# Patient Record
Sex: Female | Born: 1980 | Hispanic: No | Marital: Married | Smoking: Never smoker
Health system: Southern US, Community
[De-identification: ages and names within clinical notes are randomized; demographics above are authoritative.]

## PROBLEM LIST (undated history)

## (undated) DIAGNOSIS — N809 Endometriosis, unspecified: Secondary | ICD-10-CM

## (undated) DIAGNOSIS — J45909 Unspecified asthma, uncomplicated: Secondary | ICD-10-CM

## (undated) HISTORY — PX: NO PAST SURGERIES: SHX2092

## (undated) HISTORY — PX: DIAGNOSTIC LAPAROSCOPY: SUR761

---

## 2008-08-27 ENCOUNTER — Other Ambulatory Visit: Admission: RE | Admit: 2008-08-27 | Discharge: 2008-08-27 | Payer: Self-pay | Admitting: Family Medicine

## 2011-04-23 ENCOUNTER — Other Ambulatory Visit (HOSPITAL_COMMUNITY)
Admission: RE | Admit: 2011-04-23 | Discharge: 2011-04-23 | Disposition: A | Discharge status: Home / Self Care | Payer: 59 | Source: Ambulatory Visit | Attending: Family Medicine | Admitting: Family Medicine

## 2011-04-23 DIAGNOSIS — Z1159 Encounter for screening for other viral diseases: Secondary | ICD-10-CM | POA: Insufficient documentation

## 2011-04-23 DIAGNOSIS — Z124 Encounter for screening for malignant neoplasm of cervix: Secondary | ICD-10-CM | POA: Insufficient documentation

## 2012-05-31 ENCOUNTER — Other Ambulatory Visit: Payer: Self-pay | Admitting: Obstetrics & Gynecology

## 2012-06-01 ENCOUNTER — Encounter (HOSPITAL_COMMUNITY): Payer: Self-pay | Admitting: Pharmacy Technician

## 2012-06-02 ENCOUNTER — Encounter (HOSPITAL_COMMUNITY): Payer: Self-pay

## 2012-06-02 ENCOUNTER — Encounter (HOSPITAL_COMMUNITY)
Admission: RE | Admit: 2012-06-02 | Discharge: 2012-06-02 | Disposition: A | Discharge status: Home / Self Care | Payer: Managed Care, Other (non HMO) | Source: Ambulatory Visit | Attending: Obstetrics & Gynecology | Admitting: Obstetrics & Gynecology

## 2012-06-02 HISTORY — DX: Unspecified asthma, uncomplicated: J45.909

## 2012-06-02 LAB — CBC
MCH: 30.1 pg (ref 26.0–34.0)
Platelets: 237 10*3/uL (ref 150–400)
RBC: 4.48 MIL/uL (ref 3.87–5.11)
RDW: 12.2 % (ref 11.5–15.5)

## 2012-06-02 LAB — SURGICAL PCR SCREEN: MRSA, PCR: NEGATIVE

## 2012-06-02 NOTE — Patient Instructions (Addendum)
Your procedure is scheduled on:06/03/12  Enter through the Main Entrance at :0830 am Pick up desk phone and dial [phone] and inform us of your arrival.  Please call [phone] if you have any problems the morning of surgery.  Remember: Do not eat or drink after midnight:tonight  Bring inhaler to hospital  DO NOT wear jewelry, eye make-up, lipstick,body lotion, or dark fingernail polish. Do not shave for 48 hours prior to surgery.  If you are to be admitted after surgery, leave suitcase in car until your room has been assigned. Patients discharged on the day of surgery will not be allowed to drive home.

## 2012-06-03 ENCOUNTER — Ambulatory Visit (HOSPITAL_COMMUNITY)
Admission: RE | Admit: 2012-06-03 | Discharge: 2012-06-03 | Disposition: A | Discharge status: Home / Self Care | Payer: Managed Care, Other (non HMO) | Source: Ambulatory Visit | Attending: Obstetrics & Gynecology | Admitting: Obstetrics & Gynecology

## 2012-06-03 ENCOUNTER — Encounter (HOSPITAL_COMMUNITY)
Admission: RE | Disposition: A | Discharge status: Home / Self Care | Payer: Self-pay | Source: Ambulatory Visit | Attending: Obstetrics & Gynecology

## 2012-06-03 ENCOUNTER — Ambulatory Visit (HOSPITAL_COMMUNITY): Payer: Managed Care, Other (non HMO)

## 2012-06-03 ENCOUNTER — Encounter (HOSPITAL_COMMUNITY): Payer: Self-pay | Admitting: *Deleted

## 2012-06-03 ENCOUNTER — Encounter (HOSPITAL_COMMUNITY): Payer: Self-pay

## 2012-06-03 DIAGNOSIS — N803 Endometriosis of pelvic peritoneum, unspecified: Secondary | ICD-10-CM | POA: Insufficient documentation

## 2012-06-03 DIAGNOSIS — N83209 Unspecified ovarian cyst, unspecified side: Secondary | ICD-10-CM | POA: Insufficient documentation

## 2012-06-03 DIAGNOSIS — D259 Leiomyoma of uterus, unspecified: Secondary | ICD-10-CM | POA: Insufficient documentation

## 2012-06-03 HISTORY — PX: LAPAROSCOPIC OVARIAN CYSTECTOMY: SHX6248

## 2012-06-03 LAB — HCG, SERUM, QUALITATIVE: Preg, Serum: NEGATIVE

## 2012-06-03 SURGERY — EXCISION, CYST, OVARY, LAPAROSCOPIC
Anesthesia: General | Site: Abdomen | Laterality: Left | Wound class: Clean Contaminated

## 2012-06-03 MED ORDER — ONDANSETRON HCL 4 MG/2ML IJ SOLN
INTRAMUSCULAR | Status: AC
  Filled 2012-06-03: qty 2

## 2012-06-03 MED ORDER — CEFAZOLIN SODIUM-DEXTROSE 2-3 GM-% IV SOLR
2.0000 g | INTRAVENOUS | Status: AC
  Administered 2012-06-03: 2 g via INTRAVENOUS

## 2012-06-03 MED ORDER — DEXAMETHASONE SODIUM PHOSPHATE 10 MG/ML IJ SOLN
INTRAMUSCULAR | Status: AC
  Filled 2012-06-03: qty 1

## 2012-06-03 MED ORDER — PROPOFOL 10 MG/ML IV EMUL
INTRAVENOUS | Status: AC
  Filled 2012-06-03: qty 20

## 2012-06-03 MED ORDER — FENTANYL CITRATE 0.05 MG/ML IJ SOLN
25.0000 ug | INTRAMUSCULAR | Status: DC | PRN
  Administered 2012-06-03: 25 ug via INTRAVENOUS

## 2012-06-03 MED ORDER — 0.9 % SODIUM CHLORIDE (POUR BTL) OPTIME
TOPICAL | Status: DC | PRN
  Administered 2012-06-03: 1000 mL

## 2012-06-03 MED ORDER — OXYCODONE-ACETAMINOPHEN 5-325 MG PO TABS: 1.0000 | ORAL_TABLET | ORAL | Status: DC | PRN

## 2012-06-03 MED ORDER — GLYCOPYRROLATE 0.2 MG/ML IJ SOLN
INTRAMUSCULAR | Status: AC
  Filled 2012-06-03: qty 1

## 2012-06-03 MED ORDER — LIDOCAINE HCL (CARDIAC) 20 MG/ML IV SOLN
INTRAVENOUS | Status: DC | PRN
  Administered 2012-06-03: 60 mg via INTRAVENOUS

## 2012-06-03 MED ORDER — BUPIVACAINE HCL (PF) 0.25 % IJ SOLN
INTRAMUSCULAR | Status: AC
  Filled 2012-06-03: qty 30

## 2012-06-03 MED ORDER — PROPOFOL 10 MG/ML IV EMUL
INTRAVENOUS | Status: DC | PRN
  Administered 2012-06-03: 150 mg via INTRAVENOUS

## 2012-06-03 MED ORDER — NEOSTIGMINE METHYLSULFATE 1 MG/ML IJ SOLN
INTRAMUSCULAR | Status: AC
  Filled 2012-06-03: qty 1

## 2012-06-03 MED ORDER — MIDAZOLAM HCL 5 MG/5ML IJ SOLN
INTRAMUSCULAR | Status: DC | PRN
  Administered 2012-06-03 (×2): 1 mg via INTRAVENOUS

## 2012-06-03 MED ORDER — LACTATED RINGERS IV SOLN
INTRAVENOUS | Status: DC
Start: 2012-06-03 — End: 2012-06-03
  Administered 2012-06-03 (×4): via INTRAVENOUS

## 2012-06-03 MED ORDER — ROCURONIUM BROMIDE 100 MG/10ML IV SOLN
INTRAVENOUS | Status: DC | PRN
Start: 2012-06-03 — End: 2012-06-03
  Administered 2012-06-03: 5 mg via INTRAVENOUS
  Administered 2012-06-03: 35 mg via INTRAVENOUS
  Administered 2012-06-03 (×2): 5 mg via INTRAVENOUS

## 2012-06-03 MED ORDER — FENTANYL CITRATE 0.05 MG/ML IJ SOLN
INTRAMUSCULAR | Status: DC | PRN
  Administered 2012-06-03 (×7): 50 ug via INTRAVENOUS

## 2012-06-03 MED ORDER — MIDAZOLAM HCL 2 MG/2ML IJ SOLN
INTRAMUSCULAR | Status: AC
  Filled 2012-06-03: qty 2

## 2012-06-03 MED ORDER — OXYCODONE-ACETAMINOPHEN 7.5-325 MG PO TABS: 1.0000 | ORAL_TABLET | ORAL | Status: DC | PRN

## 2012-06-03 MED ORDER — METHYLENE BLUE 1 % INJ SOLN
INTRAMUSCULAR | Status: DC | PRN
  Administered 2012-06-03: 2 mL

## 2012-06-03 MED ORDER — FENTANYL CITRATE 0.05 MG/ML IJ SOLN
INTRAMUSCULAR | Status: AC
  Administered 2012-06-03: 25 ug via INTRAVENOUS
  Filled 2012-06-03: qty 2

## 2012-06-03 MED ORDER — BUPIVACAINE HCL (PF) 0.25 % IJ SOLN
INTRAMUSCULAR | Status: DC | PRN
  Administered 2012-06-03: 8 mL

## 2012-06-03 MED ORDER — ONDANSETRON HCL 4 MG/2ML IJ SOLN
INTRAMUSCULAR | Status: DC | PRN
  Administered 2012-06-03: 4 mg via INTRAVENOUS

## 2012-06-03 MED ORDER — NEOSTIGMINE METHYLSULFATE 1 MG/ML IJ SOLN
INTRAMUSCULAR | Status: DC | PRN
  Administered 2012-06-03: 2 mg via INTRAVENOUS

## 2012-06-03 MED ORDER — FENTANYL CITRATE 0.05 MG/ML IJ SOLN
INTRAMUSCULAR | Status: AC
  Filled 2012-06-03: qty 2

## 2012-06-03 MED ORDER — GLYCOPYRROLATE 0.2 MG/ML IJ SOLN
INTRAMUSCULAR | Status: DC | PRN
  Administered 2012-06-03: 0.4 mg via INTRAVENOUS
  Administered 2012-06-03 (×2): 0.1 mg via INTRAVENOUS

## 2012-06-03 MED ORDER — METHYLENE BLUE 1 % INJ SOLN
INTRAMUSCULAR | Status: AC
  Filled 2012-06-03: qty 1

## 2012-06-03 MED ORDER — FENTANYL CITRATE 0.05 MG/ML IJ SOLN
INTRAMUSCULAR | Status: AC
  Filled 2012-06-03: qty 5

## 2012-06-03 MED ORDER — ROCURONIUM BROMIDE 50 MG/5ML IV SOLN
INTRAVENOUS | Status: AC
  Filled 2012-06-03: qty 1

## 2012-06-03 MED ORDER — LIDOCAINE HCL (CARDIAC) 20 MG/ML IV SOLN
INTRAVENOUS | Status: AC
  Filled 2012-06-03: qty 5

## 2012-06-03 MED ORDER — GLYCOPYRROLATE 0.2 MG/ML IJ SOLN
INTRAMUSCULAR | Status: AC
  Filled 2012-06-03: qty 2

## 2012-06-03 MED ORDER — PHENYLEPHRINE HCL 10 MG/ML IJ SOLN
INTRAMUSCULAR | Status: DC | PRN
  Administered 2012-06-03 (×3): 40 ug via INTRAVENOUS
  Administered 2012-06-03: 80 ug via INTRAVENOUS

## 2012-06-03 MED ORDER — LACTATED RINGERS IR SOLN
Status: DC | PRN
  Administered 2012-06-03: 3000 mL

## 2012-06-03 MED ORDER — DEXAMETHASONE SODIUM PHOSPHATE 4 MG/ML IJ SOLN
INTRAMUSCULAR | Status: DC | PRN
  Administered 2012-06-03: 4 mg via INTRAVENOUS

## 2012-06-03 MED ORDER — OXYCODONE-ACETAMINOPHEN 5-325 MG PO TABS
ORAL_TABLET | ORAL | Status: AC
  Administered 2012-06-03: 1 via ORAL
  Filled 2012-06-03: qty 1

## 2012-06-03 SURGICAL SUPPLY — 29 items
APPLICATOR COTTON TIP 6IN STRL (MISCELLANEOUS) ×2 IMPLANT
CABLE HIGH FREQUENCY MONO STRZ (ELECTRODE) IMPLANT
CATH ROBINSON RED A/P 16FR (CATHETERS) ×2 IMPLANT
CLOTH BEACON ORANGE TIMEOUT ST (SAFETY) ×2 IMPLANT
DERMABOND ADVANCED (GAUZE/BANDAGES/DRESSINGS) ×1
DERMABOND ADVANCED .7 DNX12 (GAUZE/BANDAGES/DRESSINGS) ×1 IMPLANT
GLOVE BIO SURGEON STRL SZ 6.5 (GLOVE) ×2 IMPLANT
GLOVE BIOGEL PI IND STRL 7.0 (GLOVE) ×1 IMPLANT
GLOVE BIOGEL PI INDICATOR 7.0 (GLOVE) ×1
GOWN PREVENTION PLUS LG XLONG (DISPOSABLE) ×6 IMPLANT
IV STOPCOCK 4 WAY 40  W/Y SET (IV SOLUTION) ×1
IV STOPCOCK 4 WAY 40 W/Y SET (IV SOLUTION) ×1 IMPLANT
MANIPULATOR UTERINE 4.5 ZUMI (MISCELLANEOUS) IMPLANT
PACK LAPAROSCOPY BASIN (CUSTOM PROCEDURE TRAY) ×2 IMPLANT
PENCIL BUTTON HOLSTER BLD 10FT (ELECTRODE) ×2 IMPLANT
POUCH SPECIMEN RETRIEVAL 10MM (ENDOMECHANICALS) IMPLANT
PROTECTOR NERVE ULNAR (MISCELLANEOUS) ×2 IMPLANT
SEALER TISSUE G2 CVD JAW 35 (ENDOMECHANICALS) IMPLANT
SEALER TISSUE G2 CVD JAW 45CM (ENDOMECHANICALS) IMPLANT
SET IRRIG TUBING LAPAROSCOPIC (IRRIGATION / IRRIGATOR) ×2 IMPLANT
SUT MNCRL AB 4-0 PS2 18 (SUTURE) ×4 IMPLANT
SUT VICRYL 0 UR6 27IN ABS (SUTURE) ×4 IMPLANT
TOWEL OR 17X24 6PK STRL BLUE (TOWEL DISPOSABLE) ×4 IMPLANT
TRAY FOLEY CATH 14FR (SET/KITS/TRAYS/PACK) ×2 IMPLANT
TROCAR BALLN 12MMX100 BLUNT (TROCAR) ×2 IMPLANT
TROCAR XCEL NON-BLD 5MMX100MML (ENDOMECHANICALS) ×2 IMPLANT
TROCAR XCEL OPT SLVE 5M 100M (ENDOMECHANICALS) IMPLANT
TROCAR Z-THREAD FIOS 11X100 BL (TROCAR) IMPLANT
WATER STERILE IRR 1000ML POUR (IV SOLUTION) ×4 IMPLANT

## 2012-06-03 NOTE — Anesthesia Preprocedure Evaluation (Addendum)
Anesthesia Evaluation  Patient identified by MRN, date of birth, ID band Patient awake    Reviewed: Allergy & Precautions, H&P , Patient's Chart, lab work & pertinent test results, reviewed documented beta blocker date and time   Airway Mallampati: II TM Distance: >3 FB Neck ROM: full    Dental no notable dental hx.    Pulmonary asthma (not active) ,  breath sounds clear to auscultation  Pulmonary exam normal       Cardiovascular Rhythm:regular Rate:Normal     Neuro/Psych    GI/Hepatic   Endo/Other    Renal/GU      Musculoskeletal   Abdominal   Peds  Hematology   Anesthesia Other Findings   Reproductive/Obstetrics                          Anesthesia Physical Anesthesia Plan  ASA: II  Anesthesia Plan: General   Post-op Pain Management:    Induction: Intravenous  Airway Management Planned: Oral ETT  Additional Equipment:   Intra-op Plan:   Post-operative Plan:   Informed Consent: I have reviewed the patients History and Physical, chart, labs and discussed the procedure including the risks, benefits and alternatives for the proposed anesthesia with the patient or authorized representative who has indicated his/her understanding and acceptance.   Dental Advisory Given and Dental advisory given  Plan Discussed with: CRNA and Surgeon  Anesthesia Plan Comments: (  Discussed  general anesthesia, including possible nausea, instrumentation of airway, sore throat,pulmonary aspiration, etc. I asked if the were any outstanding questions, or  concerns before we proceeded. )        Anesthesia Quick Evaluation

## 2012-06-03 NOTE — H&P (Signed)
Alicia Lozano is an 32 y.o. female G0  RP:  Left ovarian cyst, probable Dermoid   Pertinent Gynecological History: Menses: flow is moderate Bleeding: none Contraception: none Blood transfusions: none Sexually transmitted diseases: no past history Previous GYN Procedures: none Last mammogram: none  Last pap: normal  OB History: G0  Menstrual History:  Patient's last menstrual period was 05/26/2012.    Past Medical History  Diagnosis Date  . Asthma     Past Surgical History  Procedure Laterality Date  . No past surgeries      History reviewed. No pertinent family history.  Social History:  reports that she has never smoked. She does not have any smokeless tobacco history on file. She reports that she does not drink alcohol or use illicit drugs.  Allergies: No Known Allergies  Prescriptions prior to admission  Medication Sig Dispense Refill  . albuterol (PROVENTIL HFA;VENTOLIN HFA) 108 (90 BASE) MCG/ACT inhaler Inhale 2 puffs into the lungs every 6 (six) hours as needed for wheezing or shortness of breath.         Blood pressure 103/57, pulse 58, temperature 97.3 F (36.3 C), temperature source Oral, last menstrual period 05/26/2012, SpO2 100.00%.  Pelvic US Left ovarian cyst c/w Dermoid 3.6-3.8 cm.  Ca125, CEA neg  Results for orders placed during the hospital encounter of 06/02/12 (from the past 24 hour(s))  SURGICAL PCR SCREEN     Status: None   Collection Time    06/02/12  3:50 PM      Result Value Range   MRSA, PCR NEGATIVE  NEGATIVE   Staphylococcus aureus NEGATIVE  NEGATIVE  CBC     Status: None   Collection Time    06/02/12  4:00 PM      Result Value Range   WBC 6.1  4.0 - 10.5 K/uL   RBC 4.48  3.87 - 5.11 MIL/uL   Hemoglobin 13.5  12.0 - 15.0 g/dL   HCT 16.1  09.6 - 04.5 %   MCV 89.5  78.0 - 100.0 fL   MCH 30.1  26.0 - 34.0 pg   MCHC 33.7  30.0 - 36.0 g/dL   RDW 40.9  81.1 - 91.4 %   Platelets 237  150 - 400 K/uL    No results  found.  Assessment/Plan: Left ovarian cyst, probable dermoid.  LPS left ovarian cystectomy.  Surgery and risks reviewed.  LAVOIE,MARIE-LYNE 06/03/2012, 8:54 AM

## 2012-06-03 NOTE — Transfer of Care (Signed)
Immediate Anesthesia Transfer of Care Note  Patient: Alicia Lozano  Procedure(s) Performed: Procedure(s) with comments: LAPAROSCOPIC OVARIAN CYSTECTOMY (Left) - 90 min.  Patient Location: PACU  Anesthesia Type:General  Level of Consciousness: awake, alert , oriented and patient cooperative  Airway & Oxygen Therapy: Patient Spontanous Breathing and Patient connected to nasal cannula oxygen  Post-op Assessment: Report given to PACU RN and Post -op Vital signs reviewed and stable  Post vital signs: Reviewed and stable  Complications: No apparent anesthesia complications

## 2012-06-03 NOTE — Preoperative (Signed)
Beta Blockers   Reason not to administer Beta Blockers:Not Applicable 

## 2012-06-03 NOTE — Discharge Summary (Signed)
  Physician Discharge Summary  Patient ID: MCKYNNA VANLOAN MRN: [id number] DOB/AGE: 1980/04/06 31 y.o.  Admit date: 06/03/2012 Discharge date: 06/03/2012  Admission Diagnoses: Left ovarian cyst consistent with dermoid  [address]  Discharge Diagnoses: Mild Pelvic Endometriosis, Pedunculated uterine myoma, Left pedunculated ovarian myoma.  Discharged Condition: good  Hospital Course: good  Consults: None  Treatments: surgery: Laparoscopy with conservative treatment of Endometriosis and Methylene Blue Hydrotubation.  Disposition: Final discharge disposition not confirmed     Medication List    TAKE these medications       albuterol 108 (90 BASE) MCG/ACT inhaler  Commonly known as:  PROVENTIL HFA;VENTOLIN HFA  Inhale 2 puffs into the lungs every 6 (six) hours as needed for wheezing or shortness of breath.     oxyCODONE-acetaminophen 7.5-325 MG per tablet  Commonly known as:  PERCOCET  Take 1 tablet by mouth every 4 (four) hours as needed for pain.           Follow-up Information   Follow up with LAVOIE,MARIE-LYNE, MD In 3 weeks.   Contact information:   [address] [place] [address] [phone]       Signed: Genia Del, MD 06/03/2012, 11:34 AM

## 2012-06-03 NOTE — Op Note (Signed)
06/03/2012  11:10 AM  PATIENT:  Alicia Lozano  32 y.o. female  PRE-OPERATIVE DIAGNOSIS:  Left ovarian cyst consistent with dermoid  [address]81  POST-OPERATIVE DIAGNOSIS:  Peduncutated uterine myoma and left ovarian peduncutated myoma, mild pelvic endometrosis  PROCEDURE:  Procedure(s): LAPAROSCOPY WITH CONSERVATIVE TREATMENT OF ENDOMETROSIS; HYDROTUBATION WITH METHYLENE BLUE  SURGEON:  Surgeon(s): Genia Del, MD  ASSISTANTS: Earl Gala   ANESTHESIA:   general  Anesthesiologist:  Cristela Blue MD  PROCEDURE:  Under general anesthesia with laryngeal mask. The patient is put in lithotomy position. She is prepped with ChloraPrep on the abdomen and with Betadine on the suprapubic vulvar and vaginal areas. She is draped as usual.  The vaginal exam reveals anteverted uterus all with a left adnexal mass about 4 cm. No masses felt on the right adnexa. The Foley is inserted in the bladder. The speculum is inserted in the vagina and the anterior lip of the cervix is grasped with a tenaculum. A cannula is inserted in the uterus.  The supraumbilical area is infiltrated with Marcaine one quarter plain. A 1.5 cm incision is made with a scalpel at that level. The aponeurosis is opened under direct vision with the Mayo scissors. The peritoneum is opened under direct vision with the Mayo scissors. A pursestring stitch of Vicryl 0 is done at the aponeurosis. The Roseanne Reno is inserted under direct vision.  The pneumoperitoneum is created with CO2. The camera is inserted and inspection of the abdominopelvic cavities is completed.  The subcutaneous tissue was infiltrated with Marcaine one quarter plain on the left side of the lower abdomen. We make a small 5 mm incision with a scalpel at that level. A 5 mm port is inserted under direct vision. We used an Atrovent the clamp to complete the inspection of the abdominopelvic cavities intake pictures. The liver is normal to inspection as well as the appendix. The  lesion is seen in the abdomen. And the pelvis the uterus is on normal in size and appearance except for a pedunculated fibroid on the anterior wall. This fibroid has a large base and measures about 4 cm in diameter.  The right adnexa is completely normal to inspection with a normal tube and fimbriated and a normal ovary in size and appearance.  The left adnexa reveals a normal to with normal fimbria.  The left ovary appears normal but a on pedunculated fibroid is coming off of it. The fibroid is large based and if diameter is about 3.5 cm.  The anterior cul-de-sac is normal in appearance. The posterior cul-de-sac presents to areas of endometriosis 1 on the left uterosacral ligament and the other one hour further down in the left posterior cul-de-sac.  There was 2 lesions of endometriosis are cauterized with the Kleppinger.  We then proceed with him hydrotubation with methylene blue. This confirms patency of both tubes.  Given that the fibroids are not likely to on interfere with fertility and the patient desires to conceive at this point the decision is made not to remove them.  In addition the patient is not experiencing pelvic pain and is not having any abnormal bleeding.  We irrigated and suctioned the abdominopelvic cavities. Hemostasis is adequate at all levels. Pictures were taken to confirm patency of the tubes and after cauterization of endometriosis.  Instruments were removed. The 5 mm port was removed under direct vision. The Roseanne Reno was removed. CO2 was evacuated. The pursestring stitch was attached at the supraumbilical incision. We then closed both incisions with a  subcuticular stitch of Vicryl 4-0. Dermabond was added on both incisions.  The instruments were removed from the vagina and hemostasis was adequate at that level as well.  The Foley was removed. The patient was brought to recovery room in good and stable status.  ESTIMATED BLOOD LOSS: 25 cc   Intake/Output Summary (Last 24 hours) at  06/03/12 1110 Last data filed at 06/03/12 1100  Gross per 24 hour  Intake   1600 ml  Output    125 ml  Net   1475 ml     BLOOD ADMINISTERED:none   LOCAL MEDICATIONS USED:  MARCAINE     SPECIMEN:  No Specimen  DISPOSITION OF SPECIMEN:  N/A  COUNTS:  YES   PLAN OF CARE: Transfer to PACU  Genia Del  MD  06/03/12 at 11:13

## 2012-06-06 ENCOUNTER — Encounter (HOSPITAL_COMMUNITY): Payer: Self-pay | Admitting: Obstetrics & Gynecology

## 2012-06-06 NOTE — Anesthesia Postprocedure Evaluation (Signed)
  Anesthesia Post-op Note  Patient: Alicia Lozano  Procedure(s) Performed: Procedure(s) with comments: LAPAROSCOPY WITH CONSERVATIVE TREATMENT OF ENDOMETROSIS; HYDROTUBATION WITH METHLYLELE BLUE (Left) - 90 min.  Patient is awake and responsive. Pain and nausea are reasonably well controlled. Vital signs are stable and clinically acceptable. Oxygen saturation is clinically acceptable. There are no apparent anesthetic complications at this time. Patient is ready for discharge.

## 2013-10-14 ENCOUNTER — Emergency Department (HOSPITAL_BASED_OUTPATIENT_CLINIC_OR_DEPARTMENT_OTHER)
Admission: EM | Admit: 2013-10-14 | Discharge: 2013-10-14 | Disposition: A | Discharge status: Home / Self Care | Payer: Managed Care, Other (non HMO) | Attending: Emergency Medicine | Admitting: Emergency Medicine

## 2013-10-14 ENCOUNTER — Encounter (HOSPITAL_BASED_OUTPATIENT_CLINIC_OR_DEPARTMENT_OTHER): Payer: Self-pay | Admitting: Emergency Medicine

## 2013-10-14 ENCOUNTER — Emergency Department (HOSPITAL_BASED_OUTPATIENT_CLINIC_OR_DEPARTMENT_OTHER): Payer: Managed Care, Other (non HMO)

## 2013-10-14 DIAGNOSIS — M79605 Pain in left leg: Secondary | ICD-10-CM

## 2013-10-14 DIAGNOSIS — R209 Unspecified disturbances of skin sensation: Secondary | ICD-10-CM | POA: Insufficient documentation

## 2013-10-14 DIAGNOSIS — M79609 Pain in unspecified limb: Secondary | ICD-10-CM | POA: Insufficient documentation

## 2013-10-14 DIAGNOSIS — J45909 Unspecified asthma, uncomplicated: Secondary | ICD-10-CM | POA: Insufficient documentation

## 2013-10-14 MED ORDER — PREDNISONE 10 MG PO TABS: ORAL_TABLET | ORAL | Status: DC

## 2013-10-14 NOTE — ED Provider Notes (Signed)
Medical screening examination/treatment/procedure(s) were performed by non-physician practitioner and as supervising physician I was immediately available for consultation/collaboration.   EKG Interpretation None       Orlie Dakin, MD 10/14/13 4496

## 2013-10-14 NOTE — ED Notes (Signed)
Pt c/o lower left leg numbness after two 4 hour plane flights last Tues. She denies pain. There is not warm or redness.

## 2013-10-14 NOTE — ED Provider Notes (Signed)
CSN: [id number]     Arrival date & time 10/14/13  1102 History   First MD Initiated Contact with Patient 10/14/13 1205     Chief Complaint  Patient presents with  . Leg Problem     (Consider location/radiation/quality/duration/timing/severity/associated sxs/prior Treatment) HPI Comments: She complains of lower left extremity tingling and numbness with muscular cramping that mostly affects her foot. No swelling or redness. She reports she is a runner and has not wanted to run because she is afraid she will fall. She states she travels a great deal, the most recent trip this week included a 4-hour flight, each way. She also reports a family history of blood clots and was concerned. No SOB, chest pain.  The history is provided by the patient. No language interpreter was used.    Past Medical History  Diagnosis Date  . Asthma    Past Surgical History  Procedure Laterality Date  . No past surgeries    . Laparoscopic ovarian cystectomy Left 06/03/2012    Procedure: LAPAROSCOPY WITH CONSERVATIVE TREATMENT OF ENDOMETROSIS; [place];  Surgeon: Princess Bruins, MD;  Location: [facility];  Service: Gynecology;  Laterality: Left;  90 min.   No family history on file. History  Substance Use Topics  . Smoking status: Never Smoker   . Smokeless tobacco: Not on file  . Alcohol Use: No   OB History   Grav Para Term Preterm Abortions TAB SAB Ect Mult Living                 Review of Systems  Constitutional: Negative for fever.  Respiratory: Negative for shortness of breath.   Cardiovascular: Negative for chest pain and leg swelling.  Musculoskeletal:       See HPI.  Skin: Negative for color change.  Neurological: Positive for numbness.      Allergies  Review of patient's allergies indicates no known allergies.  Home Medications   Prior to Admission medications   Medication Sig Start Date End Date Taking? Authorizing Provider  albuterol (PROVENTIL  HFA;VENTOLIN HFA) 108 (90 BASE) MCG/ACT inhaler Inhale 2 puffs into the lungs every 6 (six) hours as needed for wheezing or shortness of breath.    Historical Provider, MD  oxyCODONE-acetaminophen (PERCOCET) 7.5-325 MG per tablet Take 1 tablet by mouth every 4 (four) hours as needed for pain. 06/03/12   Princess Bruins, MD   BP 115/53  Pulse 60  Temp(Src) 98.2 F (36.8 C) (Oral)  Resp 20  Ht 5\' 4"  (1.626 m)  Wt 125 lb (56.7 kg)  BMI 21.45 kg/m2  SpO2 100%  LMP 09/30/2013 Physical Exam  Constitutional: She is oriented to person, place, and time. She appears well-developed and well-nourished.  Neck: Normal range of motion.  Pulmonary/Chest: Effort normal.  Musculoskeletal: Normal range of motion. She exhibits no edema.  No swelling or discoloration of left lower extremity. There is no point tenderness, specifically, no calf tenderness.   Neurological: She is alert and oriented to person, place, and time.  Skin: Skin is warm and dry.  Psychiatric: She has a normal mood and affect.    ED Course  Procedures (including critical care time) Labs Review Labs Reviewed - No data to display  Imaging Review No results found.   EKG Interpretation None      MDM   Final diagnoses:  None    1. Left leg pain  No evidence DVT of left leg. No neurologic deficits or deficits of strength. No back pain. She  reports symptoms some worse after sitting for longer periods. Consider sciatic nerve irritation as cause of symptoms. Will Rx steroid dose pack and PCP follow up if symptoms persist.    Dewaine Oats, PA-C 10/14/13 1337

## 2013-10-14 NOTE — Discharge Instructions (Signed)
Sciatica Sciatica is pain, weakness, numbness, or tingling along the path of the sciatic nerve. The nerve starts in the lower back and runs down the back of each leg. The nerve controls the muscles in the lower leg and in the back of the knee, while also providing sensation to the back of the thigh, lower leg, and the sole of your foot. Sciatica is a symptom of another medical condition. For instance, nerve damage or certain conditions, such as a herniated disk or bone spur on the spine, pinch or put pressure on the sciatic nerve. This causes the pain, weakness, or other sensations normally associated with sciatica. Generally, sciatica only affects one side of the body. CAUSES   Herniated or slipped disc.  Degenerative disk disease.  A pain disorder involving the narrow muscle in the buttocks (piriformis syndrome).  Pelvic injury or fracture.  Pregnancy.  Tumor (rare). SYMPTOMS  Symptoms can vary from mild to very severe. The symptoms usually travel from the low back to the buttocks and down the back of the leg. Symptoms can include:  Mild tingling or dull aches in the lower back, leg, or hip.  Numbness in the back of the calf or sole of the foot.  Burning sensations in the lower back, leg, or hip.  Sharp pains in the lower back, leg, or hip.  Leg weakness.  Severe back pain inhibiting movement. These symptoms may get worse with coughing, sneezing, laughing, or prolonged sitting or standing. Also, being overweight may worsen symptoms. DIAGNOSIS  Your caregiver will perform a physical exam to look for common symptoms of sciatica. He or she may ask you to do certain movements or activities that would trigger sciatic nerve pain. Other tests may be performed to find the cause of the sciatica. These may include:  Blood tests.  X-rays.  Imaging tests, such as an MRI or CT scan. TREATMENT  Treatment is directed at the cause of the sciatic pain. Sometimes, treatment is not necessary  and the pain and discomfort goes away on its own. If treatment is needed, your caregiver may suggest:  Over-the-counter medicines to relieve pain.  Prescription medicines, such as anti-inflammatory medicine, muscle relaxants, or narcotics.  Applying heat or ice to the painful area.  Steroid injections to lessen pain, irritation, and inflammation around the nerve.  Reducing activity during periods of pain.  Exercising and stretching to strengthen your abdomen and improve flexibility of your spine. Your caregiver may suggest losing weight if the extra weight makes the back pain worse.  Physical therapy.  Surgery to eliminate what is pressing or pinching the nerve, such as a bone spur or part of a herniated disk. HOME CARE INSTRUCTIONS   Only take over-the-counter or prescription medicines for pain or discomfort as directed by your caregiver.  Apply ice to the affected area for 20 minutes, 3-4 times a day for the first 48-72 hours. Then try heat in the same way.  Exercise, stretch, or perform your usual activities if these do not aggravate your pain.  Attend physical therapy sessions as directed by your caregiver.  Keep all follow-up appointments as directed by your caregiver.  Do not wear high heels or shoes that do not provide proper support.  Check your mattress to see if it is too soft. A firm mattress may lessen your pain and discomfort. SEEK IMMEDIATE MEDICAL CARE IF:   You lose control of your bowel or bladder (incontinence).  You have increasing weakness in the lower back, pelvis, buttocks,   or legs.  You have redness or swelling of your back.  You have a burning sensation when you urinate.  You have pain that gets worse when you lie down or awakens you at night.  Your pain is worse than you have experienced in the past.  Your pain is lasting longer than 4 weeks.  You are suddenly losing weight without reason. MAKE SURE YOU:  Understand these  instructions.  Will watch your condition.  Will get help right away if you are not doing well or get worse. Document Released: 03/03/2001 Document Revised: 09/08/2011 Document Reviewed: 07/19/2011 Exit[employer] Patient Information 2015 [employer]. This information is not intended to replace advice given to you by your health care provider. Make sure you discuss any questions you have with your health care provider.  

## 2017-01-12 ENCOUNTER — Other Ambulatory Visit: Payer: Self-pay | Admitting: Family Medicine

## 2017-01-12 DIAGNOSIS — R1011 Right upper quadrant pain: Secondary | ICD-10-CM

## 2017-01-19 ENCOUNTER — Ambulatory Visit
Admission: RE | Admit: 2017-01-19 | Discharge: 2017-01-19 | Disposition: A | Discharge status: Home / Self Care | Payer: PRIVATE HEALTH INSURANCE | Source: Ambulatory Visit | Attending: Family Medicine | Admitting: Family Medicine

## 2017-01-19 DIAGNOSIS — R1011 Right upper quadrant pain: Secondary | ICD-10-CM

## 2017-06-22 NOTE — Patient Instructions (Addendum)
Alicia Lozano  06/22/2017      Your procedure is scheduled on    06-29-17  Report to [place]  at       [phone].   Call this number if you have problems the morning of [phone]   OUR ADDRESS IS [place], WE ARE LOCATED [facility] THE [facility] WITH [facility].   Remember:  Do not eat food or drink liquids after midnight.   Take these medicines the morning of surgery with A SIP OF WATER  Inhaler and bring   Do not wear jewelry, make-up or nail polish.  Do not wear lotions, powders, or perfumes, or deoderant.  Do not shave 48 hours prior to surgery.  Men may shave face and neck.  Do not bring valuables to the hospital.  [facility] is not responsible for any belongings or valuables.  Contacts, dentures or bridgework may not be worn into surgery.  Leave your suitcase in the car.  After surgery it may be brought to your room.  For patients admitted to the hospital, discharge time will be determined by your treatment team.  Patients discharged the day of surgery will not be allowed to drive home.   Special instructions:  n/a  Please read over the following fact sheets that you were given:    [facility] - Preparing for Surgery Before surgery, you can play an important role.  Because skin is not sterile, your skin needs to be as free of germs as possible.  You can reduce the number of germs on your skin by washing with CHG (chlorahexidine gluconate) soap before surgery.  CHG is an antiseptic cleaner which kills germs and bonds with the skin to continue killing germs even after washing. Please DO NOT use if you have an allergy to CHG or antibacterial soaps.  If your skin becomes reddened/irritated stop using the CHG and inform your nurse when you arrive at Short Stay. Do not shave (including legs and underarms) for at least 48 hours prior to the first CHG shower.  You may shave your face/neck. Please follow these instructions  carefully:  1.  Shower with CHG Soap the night before surgery and the  morning of Surgery.  2.  If you choose to wash your hair, wash your hair first as usual with your  normal  shampoo.  3.  After you shampoo, rinse your hair and body thoroughly to remove the  shampoo.                           4.  Use CHG as you would any other liquid soap.  You can apply chg directly  to the skin and wash                       Gently with a scrungie or clean washcloth.  5.  Apply the CHG Soap to your body ONLY FROM THE NECK DOWN.   Do not use on face/ open                           Wound or open sores. Avoid contact with eyes, ears mouth and genitals (private parts).                       Wash face,  Genitals (private parts) with your normal soap.  6.  Wash thoroughly, paying special attention to the area where your surgery  will be performed.  7.  Thoroughly rinse your body with warm water from the neck down.  8.  DO NOT shower/wash with your normal soap after using and rinsing off  the CHG Soap.                9.  Pat yourself dry with a clean towel.            10.  Wear clean pajamas.            11.  Place clean sheets on your bed the night of your first shower and do not  sleep with pets. Day of Surgery : Do not apply any lotions/deodorants the morning of surgery.  Please wear clean clothes to the hospital/surgery center.  FAILURE TO FOLLOW THESE INSTRUCTIONS MAY RESULT IN THE CANCELLATION OF YOUR SURGERY PATIENT SIGNATURE_________________________________  NURSE SIGNATURE__________________________________  ________________________________________________________________________  WHAT IS A BLOOD TRANSFUSION? Blood Transfusion Information  A transfusion is the replacement of blood or some of its parts. Blood is made up of multiple cells which provide different functions.  Red blood cells carry oxygen and are used for blood loss replacement.  White blood cells fight against  infection.  Platelets control bleeding.  Plasma helps clot blood.  Other blood products are available for specialized needs, such as hemophilia or other clotting disorders. BEFORE THE TRANSFUSION  Who gives blood for transfusions?   Healthy volunteers who are fully evaluated to make sure their blood is safe. This is blood bank blood. Transfusion therapy is the safest it has ever been in the practice of medicine. Before blood is taken from a donor, a complete history is taken to make sure that person has no history of diseases nor engages in risky social behavior (examples are intravenous drug use or sexual activity with multiple partners). The donor's travel history is screened to minimize risk of transmitting infections, such as malaria. The donated blood is tested for signs of infectious diseases, such as HIV and hepatitis. The blood is then tested to be sure it is compatible with you in order to minimize the chance of a transfusion reaction. If you or a relative donates blood, this is often done in anticipation of surgery and is not appropriate for emergency situations. It takes many days to process the donated blood. RISKS AND COMPLICATIONS Although transfusion therapy is very safe and saves many lives, the main dangers of transfusion include:   Getting an infectious disease.  Developing a transfusion reaction. This is an allergic reaction to something in the blood you were given. Every precaution is taken to prevent this. The decision to have a blood transfusion has been considered carefully by your caregiver before blood is given. Blood is not given unless the benefits outweigh the risks. AFTER THE TRANSFUSION  Right after receiving a blood transfusion, you will usually feel much better and more energetic. This is especially true if your red blood cells have gotten low (anemic). The transfusion raises the level of the red blood cells which carry oxygen, and this usually causes an energy  increase.  The nurse administering the transfusion will monitor you carefully for complications. HOME CARE INSTRUCTIONS  No special instructions are needed after a transfusion. You may find your energy is better. Speak with your caregiver about any limitations on activity for underlying diseases you may have. SEEK MEDICAL CARE IF:   Your condition is not improving after your  transfusion.  You develop redness or irritation at the intravenous (IV) site. SEEK IMMEDIATE MEDICAL CARE IF:  Any of the following symptoms occur over the next 12 hours:  Shaking chills.  You have a temperature by mouth above 102 F (38.9 C), not controlled by medicine.  Chest, back, or muscle pain.  People around you feel you are not acting correctly or are confused.  Shortness of breath or difficulty breathing.  Dizziness and fainting.  You get a rash or develop hives.  You have a decrease in urine output.  Your urine turns a dark color or changes to pink, red, or brown. Any of the following symptoms occur over the next 10 days:  You have a temperature by mouth above 102 F (38.9 C), not controlled by medicine.  Shortness of breath.  Weakness after normal activity.  The white part of the eye turns yellow (jaundice).  You have a decrease in the amount of urine or are urinating less often.  Your urine turns a dark color or changes to pink, red, or brown. Document Released: 03/06/2000 Document Revised: 06/01/2011 Document Reviewed: 10/24/2007 [facility] Patient Information 2014 [place], [place].  _______________________________________________________________________

## 2017-06-23 ENCOUNTER — Encounter (HOSPITAL_COMMUNITY): Payer: Self-pay

## 2017-06-23 ENCOUNTER — Encounter (HOSPITAL_COMMUNITY)
Admission: RE | Admit: 2017-06-23 | Discharge: 2017-06-23 | Disposition: A | Discharge status: Home / Self Care | Payer: PRIVATE HEALTH INSURANCE | Source: Ambulatory Visit | Attending: Obstetrics and Gynecology | Admitting: Obstetrics and Gynecology

## 2017-06-23 ENCOUNTER — Other Ambulatory Visit: Payer: Self-pay

## 2017-06-23 DIAGNOSIS — Z0183 Encounter for blood typing: Secondary | ICD-10-CM | POA: Insufficient documentation

## 2017-06-23 DIAGNOSIS — D259 Leiomyoma of uterus, unspecified: Secondary | ICD-10-CM | POA: Insufficient documentation

## 2017-06-23 DIAGNOSIS — N809 Endometriosis, unspecified: Secondary | ICD-10-CM | POA: Diagnosis not present

## 2017-06-23 DIAGNOSIS — Z01812 Encounter for preprocedural laboratory examination: Secondary | ICD-10-CM | POA: Diagnosis present

## 2017-06-23 HISTORY — DX: Endometriosis, unspecified: N80.9

## 2017-06-23 LAB — CBC
HEMATOCRIT: 40.3 % (ref 36.0–46.0)
HEMOGLOBIN: 13.7 g/dL (ref 12.0–15.0)
MCH: 31.1 pg (ref 26.0–34.0)
MCHC: 34 g/dL (ref 30.0–36.0)
MCV: 91.4 fL (ref 78.0–100.0)
Platelets: 253 10*3/uL (ref 150–400)
RBC: 4.41 MIL/uL (ref 3.87–5.11)
RDW: 12.1 % (ref 11.5–15.5)
WBC: 5 10*3/uL (ref 4.0–10.5)

## 2017-06-23 LAB — ABO/RH: ABO/RH(D): AB POS

## 2017-06-24 NOTE — H&P (Signed)
Alicia Lozano is a 37 y.o. female , originally referred to me by Dr. Servando Salina, for L/S, gel-port assisted myomectomy, left ovarian cystectomy and excision of endometriosis. Her ultrasound noted Lt ovary benign complex cyst echogenic and echoic areas. AFC can't be determined on L ovary. cyst measured 4.6 x 3.1 x 2.8 cm. First fibroid 8 cm no specific measurement given today. (Grapefruit size), second fibroid fundal subserosal 5.5 x 4. 6 x 5.0 cm.  She was diagnosed with fibroids because of abnormal uterine bleeding and was placed on oral contraceptives.  She developed breakthrough bleeding and had to stop the pills.  She has been having monthly periods but with heavy flow and prolonged duration.  Patient would like to preserve her childbearing potential.  Pertinent Gynecological History: Menses: flow is excessive with use of 3 pads or tampons on heaviest days Bleeding: dysfunctional uterine bleeding Contraception: none DES exposure: denies Blood transfusions: none Sexually transmitted diseases: no past history Previous GYN Procedures: Laparoscopy  Last mammogram: normal Last pap: normal  OB History: G1P0010   Menstrual History: Menarche age: 57 No LMP recorded.    Past Medical History:  Diagnosis Date  . Asthma   . Endometriosis                     Past Surgical History:  Procedure Laterality Date  . DIAGNOSTIC LAPAROSCOPY     gel port assisted myomectomy left ovarian cystectomy and excision of endometriosis Dr. Kerin Perna 06-29-17  . LAPAROSCOPIC OVARIAN CYSTECTOMY Left 06/03/2012   Procedure: LAPAROSCOPY WITH CONSERVATIVE TREATMENT OF ENDOMETROSIS; [place];  Surgeon: Princess Bruins, MD;  Location: [facility];  Service: Gynecology;  Laterality: Left;  90 min.  . NO PAST SURGERIES               No family history on file. No hereditary disease.  No cancer of breast, ovary, uterus. No cutaneous leiomyomatosis or renal cell carcinoma.  Social  History   Socioeconomic History  . Marital status: Married    Spouse name: Not on file  . Number of children: Not on file  . Years of education: Not on file  . Highest education level: Not on file  Occupational History  . Not on file  Social Needs  . Financial resource strain: Not on file  . Food insecurity:    Worry: Not on file    Inability: Not on file  . Transportation needs:    Medical: Not on file    Non-medical: Not on file  Tobacco Use  . Smoking status: Never Smoker  . Smokeless tobacco: Never Used  Substance and Sexual Activity  . Alcohol use: No  . Drug use: No  . Sexual activity: Yes  Lifestyle  . Physical activity:    Days per week: Not on file    Minutes per session: Not on file  . Stress: Not on file  Relationships  . Social connections:    Talks on phone: Not on file    Gets together: Not on file    Attends religious service: Not on file    Active member of club or organization: Not on file    Attends meetings of clubs or organizations: Not on file    Relationship status: Not on file  . Intimate partner violence:    Fear of current or ex partner: Not on file    Emotionally abused: Not on file    Physically abused: Not on file    Forced  sexual activity: Not on file  Other Topics Concern  . Not on file  Social History Narrative  . Not on file    No Known Allergies  No current facility-administered medications on file prior to encounter.    Current Outpatient Medications on File Prior to Encounter  Medication Sig Dispense Refill  . albuterol (PROVENTIL HFA;VENTOLIN HFA) 108 (90 BASE) MCG/ACT inhaler Inhale 2 puffs into the lungs every 6 (six) hours as needed for wheezing or shortness of breath.    . Multiple Vitamins-Minerals (CENTRUM SILVER PO) Take 2 tablets by mouth daily.    . Omega-3 Fatty Acids (FISH OIL PO) Take 2 capsules by mouth daily.       Review of Systems  Constitutional: Negative.   HENT: Negative.   Eyes: Negative.    Respiratory: Negative.   Cardiovascular: Negative.   Gastrointestinal: Negative.   Genitourinary: Negative.   Musculoskeletal: Negative.   Skin: Negative.   Neurological: Negative.   Endo/Heme/Allergies: Negative.   Psychiatric/Behavioral: Negative.      Physical Exam  There were no vitals taken for this visit. Constitutional: She is oriented to person, place, and time. She appears well-developed and well-nourished.  HENT:  Head: Normocephalic and atraumatic.  Nose: Nose normal.  Mouth/Throat: Oropharynx is clear and moist. No oropharyngeal exudate.  Eyes: Conjunctivae normal and EOM are normal. Pupils are equal, round, and reactive to light. No scleral icterus.  Neck: Normal range of motion. Neck supple. No tracheal deviation present. No thyromegaly present.  Cardiovascular: Normal rate.   Respiratory: Effort normal and breath sounds normal.  GI: Soft. Bowel sounds are normal. She exhibits no distension and no mass. There is no tenderness.  Lymphadenopathy:    She has no cervical adenopathy.  Neurological: She is alert and oriented to person, place, and time. She has normal reflexes.  Skin: Skin is warm.  Psychiatric: She has a normal mood and affect. Her behavior is normal. Judgment and thought content normal.       Assessment/Plan:  Intramural and subserosal uterine myomas,endometriosis,ovarian cyst, causing menorrhagia and pressure sensation. Preoperative for L/S, gel-port assisted myomectomy, left ovarian cystectomy and excision of endometriosis- Benefits and risks of L/S, gel-port assisted myomectomy, left ovarian cystectomy and excision of endometriosis- were discussed with the patient and her family member again.  Bowel prep instructions were given.  All of patient's questions were answered.  She verbalized understanding.  She knows that she will need a cesarean delivery for future pregnancies, and that it is recommended she does not conceive for 2-3 months for uterus  to heal.

## 2017-06-29 ENCOUNTER — Encounter (HOSPITAL_BASED_OUTPATIENT_CLINIC_OR_DEPARTMENT_OTHER): Payer: Self-pay | Admitting: Anesthesiology

## 2017-06-29 ENCOUNTER — Other Ambulatory Visit: Payer: Self-pay

## 2017-06-29 ENCOUNTER — Ambulatory Visit (HOSPITAL_BASED_OUTPATIENT_CLINIC_OR_DEPARTMENT_OTHER)
Admission: RE | Admit: 2017-06-29 | Discharge: 2017-06-29 | Disposition: A | Discharge status: Home / Self Care | Payer: PRIVATE HEALTH INSURANCE | Source: Ambulatory Visit | Attending: Obstetrics and Gynecology | Admitting: Obstetrics and Gynecology

## 2017-06-29 ENCOUNTER — Encounter (HOSPITAL_BASED_OUTPATIENT_CLINIC_OR_DEPARTMENT_OTHER)
Admission: RE | Disposition: A | Discharge status: Home / Self Care | Payer: Self-pay | Source: Ambulatory Visit | Attending: Obstetrics and Gynecology

## 2017-06-29 ENCOUNTER — Ambulatory Visit (HOSPITAL_BASED_OUTPATIENT_CLINIC_OR_DEPARTMENT_OTHER): Payer: PRIVATE HEALTH INSURANCE | Admitting: Certified Registered Nurse Anesthetist

## 2017-06-29 DIAGNOSIS — N803 Endometriosis of pelvic peritoneum: Secondary | ICD-10-CM | POA: Diagnosis not present

## 2017-06-29 DIAGNOSIS — D252 Subserosal leiomyoma of uterus: Secondary | ICD-10-CM | POA: Insufficient documentation

## 2017-06-29 DIAGNOSIS — D271 Benign neoplasm of left ovary: Secondary | ICD-10-CM | POA: Insufficient documentation

## 2017-06-29 DIAGNOSIS — R102 Pelvic and perineal pain: Secondary | ICD-10-CM | POA: Insufficient documentation

## 2017-06-29 DIAGNOSIS — D259 Leiomyoma of uterus, unspecified: Secondary | ICD-10-CM | POA: Diagnosis present

## 2017-06-29 DIAGNOSIS — N920 Excessive and frequent menstruation with regular cycle: Secondary | ICD-10-CM | POA: Diagnosis not present

## 2017-06-29 HISTORY — PX: LAPAROSCOPIC GELPORT ASSISTED MYOMECTOMY: SHX6549

## 2017-06-29 LAB — POCT PREGNANCY, URINE: PREG TEST UR: NEGATIVE

## 2017-06-29 LAB — TYPE AND SCREEN
ABO/RH(D): AB POS
ANTIBODY SCREEN: NEGATIVE

## 2017-06-29 SURGERY — LAPAROSCOPIC GELPORT ASSISTED MYOMECTOMY
Anesthesia: General

## 2017-06-29 MED ORDER — ONDANSETRON HCL 4 MG PO TABS: 4.0000 mg | ORAL_TABLET | Freq: Three times a day (TID) | ORAL | 0 refills | Status: AC | PRN

## 2017-06-29 MED ORDER — CEFAZOLIN SODIUM-DEXTROSE 2-4 GM/100ML-% IV SOLN
INTRAVENOUS | Status: AC
  Filled 2017-06-29: qty 100

## 2017-06-29 MED ORDER — METOCLOPRAMIDE HCL 5 MG/ML IJ SOLN
10.0000 mg | Freq: Once | INTRAMUSCULAR | Status: DC | PRN
  Filled 2017-06-29: qty 2

## 2017-06-29 MED ORDER — MEPERIDINE HCL 25 MG/ML IJ SOLN
6.2500 mg | INTRAMUSCULAR | Status: DC | PRN
  Filled 2017-06-29: qty 1

## 2017-06-29 MED ORDER — LACTATED RINGERS IV SOLN
INTRAVENOUS | Status: DC
  Administered 2017-06-29 (×2): via INTRAVENOUS
  Filled 2017-06-29: qty 1000

## 2017-06-29 MED ORDER — HYDROMORPHONE HCL 1 MG/ML IJ SOLN
0.2500 mg | INTRAMUSCULAR | Status: DC | PRN
  Administered 2017-06-29: 0.25 mg via INTRAVENOUS
  Administered 2017-06-29: 0.5 mg via INTRAVENOUS
  Administered 2017-06-29: 0.25 mg via INTRAVENOUS
  Filled 2017-06-29: qty 0.5

## 2017-06-29 MED ORDER — OXYCODONE-ACETAMINOPHEN 7.5-325 MG PO TABS: 1.0000 | ORAL_TABLET | ORAL | 0 refills | Status: AC | PRN

## 2017-06-29 MED ORDER — ROCURONIUM BROMIDE 50 MG/5ML IV SOSY
PREFILLED_SYRINGE | INTRAVENOUS | Status: DC | PRN
  Administered 2017-06-29: 50 mg via INTRAVENOUS

## 2017-06-29 MED ORDER — FENTANYL CITRATE (PF) 250 MCG/5ML IJ SOLN
INTRAMUSCULAR | Status: AC
  Filled 2017-06-29: qty 5

## 2017-06-29 MED ORDER — VASOPRESSIN 20 UNIT/ML IV SOLN
INTRAVENOUS | Status: DC | PRN
  Administered 2017-06-29: 30 mL via INTRAMUSCULAR

## 2017-06-29 MED ORDER — LIDOCAINE 2% (20 MG/ML) 5 ML SYRINGE
INTRAMUSCULAR | Status: AC
Start: 2017-06-29 — End: 2017-06-29
  Filled 2017-06-29: qty 5

## 2017-06-29 MED ORDER — BUPIVACAINE-EPINEPHRINE 0.5% -1:200000 IJ SOLN
INTRAMUSCULAR | Status: DC | PRN
  Administered 2017-06-29: 20 mL

## 2017-06-29 MED ORDER — HYDROCODONE-ACETAMINOPHEN 7.5-325 MG PO TABS
1.0000 | ORAL_TABLET | Freq: Once | ORAL | Status: DC | PRN
  Filled 2017-06-29: qty 1

## 2017-06-29 MED ORDER — ROCURONIUM BROMIDE 10 MG/ML (PF) SYRINGE
PREFILLED_SYRINGE | INTRAVENOUS | Status: AC
Start: 2017-06-29 — End: 2017-06-29
  Filled 2017-06-29: qty 5

## 2017-06-29 MED ORDER — DEXAMETHASONE SODIUM PHOSPHATE 10 MG/ML IJ SOLN
INTRAMUSCULAR | Status: DC | PRN
  Administered 2017-06-29: 10 mg via INTRAVENOUS

## 2017-06-29 MED ORDER — LIDOCAINE 2% (20 MG/ML) 5 ML SYRINGE
INTRAMUSCULAR | Status: DC | PRN
  Administered 2017-06-29: 80 mg via INTRAVENOUS

## 2017-06-29 MED ORDER — HYDROMORPHONE HCL 1 MG/ML IJ SOLN
INTRAMUSCULAR | Status: AC
  Filled 2017-06-29: qty 1

## 2017-06-29 MED ORDER — DEXAMETHASONE SODIUM PHOSPHATE 10 MG/ML IJ SOLN
INTRAMUSCULAR | Status: AC
  Filled 2017-06-29: qty 1

## 2017-06-29 MED ORDER — SUGAMMADEX SODIUM 200 MG/2ML IV SOLN
INTRAVENOUS | Status: DC | PRN
  Administered 2017-06-29: 200 mg via INTRAVENOUS

## 2017-06-29 MED ORDER — PROPOFOL 10 MG/ML IV BOLUS
INTRAVENOUS | Status: DC | PRN
  Administered 2017-06-29: 160 mg via INTRAVENOUS

## 2017-06-29 MED ORDER — CEFAZOLIN SODIUM-DEXTROSE 2-4 GM/100ML-% IV SOLN
2.0000 g | INTRAVENOUS | Status: AC
  Administered 2017-06-29: 2 g via INTRAVENOUS
  Filled 2017-06-29: qty 100

## 2017-06-29 MED ORDER — EPHEDRINE SULFATE-NACL 50-0.9 MG/10ML-% IV SOSY
PREFILLED_SYRINGE | INTRAVENOUS | Status: DC | PRN
  Administered 2017-06-29: 10 mg via INTRAVENOUS

## 2017-06-29 MED ORDER — ONDANSETRON HCL 4 MG/2ML IJ SOLN
INTRAMUSCULAR | Status: AC
  Filled 2017-06-29: qty 2

## 2017-06-29 MED ORDER — SUGAMMADEX SODIUM 200 MG/2ML IV SOLN
INTRAVENOUS | Status: AC
  Filled 2017-06-29: qty 2

## 2017-06-29 MED ORDER — KETOROLAC TROMETHAMINE 30 MG/ML IJ SOLN
INTRAMUSCULAR | Status: DC | PRN
  Administered 2017-06-29: 30 mg via INTRAVENOUS

## 2017-06-29 MED ORDER — MIDAZOLAM HCL 2 MG/2ML IJ SOLN
INTRAMUSCULAR | Status: DC | PRN
  Administered 2017-06-29: 2 mg via INTRAVENOUS

## 2017-06-29 MED ORDER — ONDANSETRON HCL 4 MG/2ML IJ SOLN
INTRAMUSCULAR | Status: DC | PRN
  Administered 2017-06-29: 4 mg via INTRAVENOUS

## 2017-06-29 MED ORDER — PROPOFOL 10 MG/ML IV BOLUS
INTRAVENOUS | Status: AC
  Filled 2017-06-29: qty 20

## 2017-06-29 MED ORDER — METHYLENE BLUE 0.5 % INJ SOLN
INTRAVENOUS | Status: DC | PRN
  Administered 2017-06-29: 2 mL via SUBMUCOSAL

## 2017-06-29 MED ORDER — MIDAZOLAM HCL 2 MG/2ML IJ SOLN
INTRAMUSCULAR | Status: AC
  Filled 2017-06-29: qty 2

## 2017-06-29 MED ORDER — FENTANYL CITRATE (PF) 100 MCG/2ML IJ SOLN
INTRAMUSCULAR | Status: DC | PRN
  Administered 2017-06-29 (×3): 50 ug via INTRAVENOUS
  Administered 2017-06-29: 100 ug via INTRAVENOUS

## 2017-06-29 SURGICAL SUPPLY — 68 items
BAG RETRIEVAL 10 (BASKET)
BAG RETRIEVAL 10MM (BASKET)
BLADE EXTENDED COATED 6.5IN (ELECTRODE) IMPLANT
CABLE HIGH FREQUENCY MONO STRZ (ELECTRODE) ×3 IMPLANT
CLEANER CAUTERY TIP 5X5 PAD (MISCELLANEOUS) IMPLANT
CLOSURE WOUND 1/2 X4 (GAUZE/BANDAGES/DRESSINGS) ×1
CONT SPEC 4OZ CLIKSEAL STRL BL (MISCELLANEOUS) ×3 IMPLANT
COVER MAYO STAND STRL (DRAPES) ×3 IMPLANT
DERMABOND ADVANCED (GAUZE/BANDAGES/DRESSINGS) ×6
DERMABOND ADVANCED .7 DNX12 (GAUZE/BANDAGES/DRESSINGS) ×3 IMPLANT
DRSG COVADERM PLUS 2X2 (GAUZE/BANDAGES/DRESSINGS) ×6 IMPLANT
DRSG OPSITE POSTOP 3X4 (GAUZE/BANDAGES/DRESSINGS) ×3 IMPLANT
DRSG TEGADERM 4X4.75 (GAUZE/BANDAGES/DRESSINGS) IMPLANT
DRSG TELFA 3X8 NADH (GAUZE/BANDAGES/DRESSINGS) IMPLANT
DURAPREP 26ML APPLICATOR (WOUND CARE) ×3 IMPLANT
ELECT NEEDLE TIP 2.8 STRL (NEEDLE) IMPLANT
ELECT REM PT RETURN 9FT ADLT (ELECTROSURGICAL) ×3
ELECTRODE REM PT RTRN 9FT ADLT (ELECTROSURGICAL) ×1 IMPLANT
GLOVE BIO SURGEON STRL SZ8 (GLOVE) ×6 IMPLANT
GOWN STRL REUS W/TWL XL LVL3 (GOWN DISPOSABLE) ×6 IMPLANT
HOLDER FOLEY CATH W/STRAP (MISCELLANEOUS) IMPLANT
KIT TURNOVER CYSTO (KITS) ×3 IMPLANT
MANIPULATOR UTERINE 4.5 ZUMI (MISCELLANEOUS) ×3 IMPLANT
NDL SAFETY ECLIPSE 18X1.5 (NEEDLE) ×1 IMPLANT
NEEDLE HYPO 18GX1.5 SHARP (NEEDLE) ×2
NEEDLE HYPO 22GX1.5 SAFETY (NEEDLE) ×6 IMPLANT
NEEDLE INSUFFLATION 14GA 120MM (NEEDLE) ×3 IMPLANT
NS IRRIG 500ML POUR BTL (IV SOLUTION) ×3 IMPLANT
PACK LAPAROSCOPY BASIN (CUSTOM PROCEDURE TRAY) ×3 IMPLANT
PACK TRENDGUARD 450 HYBRID PRO (MISCELLANEOUS) ×1 IMPLANT
PAD CLEANER CAUTERY TIP 5X5 (MISCELLANEOUS)
PAD OB MATERNITY 4.3X12.25 (PERSONAL CARE ITEMS) ×3 IMPLANT
PENCIL BUTTON HOLSTER BLD 10FT (ELECTRODE) ×3 IMPLANT
SEPRAFILM MEMBRANE 5X6 (MISCELLANEOUS) ×6 IMPLANT
SET IRRIG TUBING LAPAROSCOPIC (IRRIGATION / IRRIGATOR) ×3 IMPLANT
SET TRI-LUMEN FLTR TB AIRSEAL (TUBING) IMPLANT
SHEARS HARMONIC ACE PLUS 36CM (ENDOMECHANICALS) IMPLANT
SPONGE LAP 18X18 X RAY DECT (DISPOSABLE) IMPLANT
STOPCOCK 4 WAY LG BORE MALE ST (IV SETS) ×3 IMPLANT
STRIP CLOSURE SKIN 1/2X4 (GAUZE/BANDAGES/DRESSINGS) ×2 IMPLANT
SUT MNCRL AB 4-0 PS2 18 (SUTURE) ×3 IMPLANT
SUT VIC AB 0 CT1 36 (SUTURE) IMPLANT
SUT VIC AB 2-0 CT1 (SUTURE) ×9 IMPLANT
SUT VIC AB 2-0 CT2 27 (SUTURE) IMPLANT
SUT VIC AB 2-0 UR6 27 (SUTURE) IMPLANT
SUT VIC AB 3-0 SH 27 (SUTURE) ×2
SUT VIC AB 3-0 SH 27X BRD (SUTURE) ×1 IMPLANT
SUT VIC AB 4-0 SH 27 (SUTURE) ×4
SUT VIC AB 4-0 SH 27XANBCTRL (SUTURE) ×2 IMPLANT
SUT VIC AB 5-0 PS2 18 (SUTURE) ×3 IMPLANT
SYR 30ML LL (SYRINGE) ×3 IMPLANT
SYR 50ML LL SCALE MARK (SYRINGE) ×3 IMPLANT
SYR 5ML LL (SYRINGE) ×3 IMPLANT
SYR CONTROL 10ML LL (SYRINGE) ×6 IMPLANT
SYS BAG RETRIEVAL 10MM (BASKET)
SYS LAPSCP GELPORT 120MM (MISCELLANEOUS) ×3
SYSTEM BAG RETRIEVAL 10MM (BASKET) IMPLANT
SYSTEM LAPSCP GELPORT 120MM (MISCELLANEOUS) ×1 IMPLANT
TAPE STRIPS DRAPE STRL (GAUZE/BANDAGES/DRESSINGS) ×3 IMPLANT
TOWEL OR 17X24 6PK STRL BLUE (TOWEL DISPOSABLE) ×6 IMPLANT
TRAY FOLEY CATH SILVER 14FR (SET/KITS/TRAYS/PACK) ×3 IMPLANT
TRENDGUARD 450 HYBRID PRO PACK (MISCELLANEOUS) ×3
TROCAR OPTI TIP 5M 100M (ENDOMECHANICALS) ×6 IMPLANT
TROCAR PORT AIRSEAL 5X120 (TROCAR) IMPLANT
TROCAR XCEL DIL TIP R 11M (ENDOMECHANICALS) IMPLANT
TUBING INSUF HEATED (TUBING) ×3 IMPLANT
WARMER LAPAROSCOPE (MISCELLANEOUS) ×3 IMPLANT
WATER STERILE IRR 500ML POUR (IV SOLUTION) ×3 IMPLANT

## 2017-06-29 NOTE — Transfer of Care (Signed)
Immediate Anesthesia Transfer of Care Note  Patient: Alicia Lozano  Procedure(s) Performed: LAPAROSCOPIC GELPORT ASSISTED MYOMECTOMY, LEFT OVARIAN DERMOID CYST REMOVAL, ABLATION OF ENDOMETRIOSIS AND CHROMOPERTUBATION (N/A )  Patient Location: PACU  Anesthesia Type:General  Level of Consciousness: awake, alert  and oriented  Airway & Oxygen Therapy: Patient Spontanous Breathing and Patient connected to nasal cannula oxygen  Post-op Assessment: Report given to RN and Post -op Vital signs reviewed and stable  Post vital signs: Reviewed and stable  Last Vitals:  Vitals Value Taken Time  BP 119/55 06/29/2017  2:07 PM  Temp    Pulse 98 06/29/2017  2:09 PM  Resp 9 06/29/2017  2:09 PM  SpO2 100 % 06/29/2017  2:09 PM  Vitals shown include unvalidated device data.  Last Pain:  Vitals:   06/29/17 0844  TempSrc:   PainSc: 0-No pain         Complications: No apparent anesthesia complications

## 2017-06-29 NOTE — Anesthesia Procedure Notes (Signed)
Procedure Name: Intubation Date/Time: 06/29/2017 11:45 AM Performed by: Genelle Bal, CRNA Pre-anesthesia Checklist: Patient identified, Emergency Drugs available, Suction available and Patient being monitored Patient Re-evaluated:Patient Re-evaluated prior to induction Oxygen Delivery Method: Circle system utilized Preoxygenation: Pre-oxygenation with 100% oxygen Induction Type: IV induction Ventilation: Mask ventilation without difficulty Laryngoscope Size: Miller and 2 Grade View: Grade I Tube type: Oral Tube size: 7.0 mm Number of attempts: 1 Airway Equipment and Method: Stylet and Oral airway Placement Confirmation: ETT inserted through vocal cords under direct vision,  positive ETCO2 and breath sounds checked- equal and bilateral Secured at: 21 cm Tube secured with: Tape Dental Injury: Teeth and Oropharynx as per pre-operative assessment

## 2017-06-29 NOTE — Discharge Instructions (Signed)

## 2017-06-29 NOTE — OR Nursing (Signed)
PRELIMINARY WEIGHING OF MYOMA SPECIMEN IN OR SHOWED A WEIGHT OF 326 GRAMS. - K. IRVING RN

## 2017-06-29 NOTE — Op Note (Signed)
Operative Note  Preoperative diagnosis: Uterine fibroids, left ovarian cyst,rule out endometriosis, pelvic pain  Postoperative diagnosis: Uterine fibroids, subserosal (326 g), left ovarian benign cystic teratoma, stage I endometriosis of pelvic peritoneum, pelvic pain  Procedure: Laparoscopy, GelPort assisted myomectomy, Removal of left ovarian benign cystic teratoma, vaporization of peritoneal endometriosis, chromotubation  Anesthesia: Gen. endotracheal  Complications: None  Estimated blood loss: 50 mL  Specimens: Uterine myomas and left ovarian cyst  to pathology  Findings: On examination under anesthesia, external genitalia, Bartholin's, Skene's, and urethra were normal. The vagina was normal. The cervix was nulliparous and appeared grossly normal. The uterus was 12 week size, firm and mobile with irregularities caused by myomas. It sounded to 8 cm. The cervix was stenotic and could not be dilated. On laparoscopy, upper abdomen, liver surface and diaphragm surfaces were normal. Gallbladder was normal. The appendix was retrocecal and appeared normal.  The uterus contained a pedunculated anterior fundal myoma measuring 7.8 x 7 cm. The uterine corpus itself did not have any intramural myomas per preoperative ultrasound and intraoperative palpation.there was a 5.5 x 4.7 cm posterior left pedunculated myoma that grew at the insertion of the left utero-ovarian ligament to the uterus, in such a way that the left ovary initially appeared attached to the fibroid. The left tube was displaced from the size of the same myoma. The left tube was otherwise normal proximally and distally with 5 out of 5 fimbria. Although we could not show dye filling the tube  this may have been due to technical problem. The right tube appeared normal proximally and distally with 5 out of 5 fimbria. The tube was  patent to chromotubation. The right ovary was normal. The left ovary contained a 4 x 3 cm dermoid cyst  which was carefully shelled out. There was a brown lesion of endometriosis lateral to the left uterosacral ligament as well as a brown lesion in the left ovarian fossa. Both of these lesions were ablated with needle electrode in cutting current mode. Description of the procedure: The patient was placed in dorsal supine position and general endotracheal anesthesia was given. 2 g of cefazolin were given intravenously for prophylaxis. Patient was placed in lithotomy position. She was prepped and draped in sterile manner.  A Foley catheter was inserted into the bladder. A ZUMI catheter could not be inserted into the uterine cavity because of the cervical stenosis after sounding it to 8 cm therefore we used a Cohen cannula. This was connected to a syringe containing diluted methylene blue solution which was used to define the endometrium during the myomectomy. The surgeon was regloved and a surgical field was created on the abdomen.  After preemptive anesthesia of all surgical sites with 0.5% bupivacaine, a 5 mm intraumbilical skin incision was made and a Verress needle was inserted. Its correct location was confirmed. A pneumoperitoneum was created with carbon dioxide.  5 mm laparoscope with a 30 lens was inserted and video laparoscopy was started . A right lower quadrant  5 mm incision was made and ancillary trochar was placed under direct visualization. Above findings were noted.  A dilute solution of vasopressin (0.4 units per mL) was injected into the myometrium overlying the fundal myoma, until the myometrium blanched. The pedicle of the myoma was clamped with Kelly clamp. A needle electrode with 49 W of cutting current  was used to make a transverse incision distal to the clamp and the myoma was removed. The pedicle was transfixion suture ligated with a 2-0 Vicryl.  We then made a 3 cm transverse suprapubic incision to insert a GelPort. After dissection of the anatomic layers, the peritoneal cavity was  entered. A GelPort was placed and the rest of the case was performed either using this port as a laparoscopic port or as a minilaparotomy port.  The rest of the myoma had to be in situ morcellated to eventually take it out of this 3 cm incision. This was carried out by blunt and sharp dissection and in situ morcellation was performed with #10 blade.  We then made an incision on the broad ligament covering the posterior pedunculated myoma in order to dissect the left posterior myoma without disrupting the left ovary which was attached to the fibroid. We were able to remove the entire myoma by in situ morcellation staying in the Serial. Then the redundant serosa was trimmed off and the left utero-ovarian ligament was reimplanted using 2-0 Vicryl continuous suture on to the posterior uterus. The that space in the myoma defect was closed with 2-0 Vicryl continuous interlocking suture and a 4-0 Vicryl continuous suture was placed on the serosa. Attention was then turned to the left ovary which was enlarged with a mass. A superficial incision was made on the ovarian capsule and contents of a dermoid immediately extruded. The edges of the normal ovarian capsule was grasped and the dermoid was carefully dissected from the normal ovarian tissue. Hemostasis was obtained with needle tip electrode in coagulating current mode. A 4-0 Vicryl continuous interlocking suture was placed to approximated created by the cystectomy. Since the edges of the ovarian capsule came together. No additional suture was placed on the ovarian capsule. We then used needle electrode inserted 5 W of cutting current to fulgurate the peritoneal lesions of endometriosis. Chromotubation was repeated. The suprapubic fascial incision was closed with 2-0 Vicryl continuous suture. Subcutaneous tissue was irrigated and aspirated good hemostasis was achieved. The abdomen and the pelvis was carefully inspected under laparoscopic visualization and the pelvis  was copiously irrigated and aspirated. A slurry of 2 sheets of Seprafilm in 60 mL of normal saline was injected as an adhesion barrier into the pelvis. The gas was allowed to escape. The instrument and the lap pad count were correct. The trochars were removed. The skin incisions were approximated with Dermabond, except for the 3 cm suprapubic incision which was approximated with 4-0 Monocryl in subcuticular stitches. The patient tolerated the procedure well and was transferred to recovery room in satisfactory condition.  SPECIAL NOTE: Since the myomectomy involved only removal of pedunculated myomas, this patient can deliver vaginally and does not need a cesarean section.   Governor Specking, MD

## 2017-06-29 NOTE — Anesthesia Preprocedure Evaluation (Signed)
Anesthesia Evaluation  Patient identified by MRN, date of birth, ID band Patient awake    Reviewed: Allergy & Precautions, NPO status , Patient's Chart, lab work & pertinent test results  Airway Mallampati: II  TM Distance: >3 FB Neck ROM: Full    Dental no notable dental hx. (+) Teeth Intact   Pulmonary asthma ,    Pulmonary exam normal breath sounds clear to auscultation       Cardiovascular negative cardio ROS Normal cardiovascular exam Rhythm:Regular Rate:Normal     Neuro/Psych negative neurological ROS     GI/Hepatic negative GI ROS, Neg liver ROS,   Endo/Other  negative endocrine ROS  Renal/GU negative Renal ROS  negative genitourinary   Musculoskeletal   Abdominal   Peds  Hematology negative hematology ROS (+)   Anesthesia Other Findings   Reproductive/Obstetrics Endometriosis Fibroid uterus                             Anesthesia Physical Anesthesia Plan  ASA: II  Anesthesia Plan: General   Post-op Pain Management:    Induction: Intravenous  PONV Risk Score and Plan: Scopolamine patch - Pre-op, Midazolam, Dexamethasone, Ondansetron and Treatment may vary due to age or medical condition  Airway Management Planned: Oral ETT  Additional Equipment:   Intra-op Plan:   Post-operative Plan: Extubation in OR  Informed Consent: I have reviewed the patients History and Physical, chart, labs and discussed the procedure including the risks, benefits and alternatives for the proposed anesthesia with the patient or authorized representative who has indicated his/her understanding and acceptance.   Dental advisory given  Plan Discussed with: CRNA and Anesthesiologist  Anesthesia Plan Comments:         Anesthesia Quick Evaluation

## 2017-06-30 ENCOUNTER — Encounter (HOSPITAL_BASED_OUTPATIENT_CLINIC_OR_DEPARTMENT_OTHER): Payer: Self-pay | Admitting: Obstetrics and Gynecology

## 2017-06-30 NOTE — Anesthesia Postprocedure Evaluation (Signed)
Anesthesia Post Note  Patient: Alicia Lozano  Procedure(s) Performed: LAPAROSCOPIC GELPORT ASSISTED MYOMECTOMY, LEFT OVARIAN DERMOID CYST REMOVAL, ABLATION OF ENDOMETRIOSIS AND CHROMOPERTUBATION (N/A )     Patient location during evaluation: PACU Anesthesia Type: General Level of consciousness: awake Pain management: pain level controlled Vital Signs Assessment: post-procedure vital signs reviewed and stable Respiratory status: spontaneous breathing Cardiovascular status: stable Postop Assessment: no apparent nausea or vomiting Anesthetic complications: no    Last Vitals:  Vitals:   06/29/17 1535 06/29/17 1610  BP:  (!) 115/57  Pulse: 100 (!) 101  Resp: 16 20  Temp:  36.8 C  SpO2: 100% 100%    Last Pain:  Vitals:   06/30/17 0904  TempSrc:   PainSc: 7    Pain Goal: Patients Stated Pain Goal: 6 (06/29/17 1610)               Durenda Pechacek JR,JOHN Mateo Flow

## 2019-03-15 ENCOUNTER — Ambulatory Visit: Payer: PRIVATE HEALTH INSURANCE | Attending: Internal Medicine

## 2019-03-15 DIAGNOSIS — Z20822 Contact with and (suspected) exposure to covid-19: Secondary | ICD-10-CM

## 2019-03-16 LAB — NOVEL CORONAVIRUS, NAA: SARS-CoV-2, NAA: NOT DETECTED

## 2019-11-23 ENCOUNTER — Other Ambulatory Visit: Payer: Self-pay | Admitting: Gastroenterology

## 2019-11-23 DIAGNOSIS — R1310 Dysphagia, unspecified: Secondary | ICD-10-CM

## 2019-12-01 ENCOUNTER — Ambulatory Visit
Admission: RE | Admit: 2019-12-01 | Discharge: 2019-12-01 | Disposition: A | Discharge status: Home / Self Care | Payer: PRIVATE HEALTH INSURANCE | Source: Ambulatory Visit | Attending: Gastroenterology | Admitting: Gastroenterology

## 2019-12-01 DIAGNOSIS — R1310 Dysphagia, unspecified: Secondary | ICD-10-CM

## 2021-02-20 ENCOUNTER — Other Ambulatory Visit: Payer: Self-pay | Admitting: Gastroenterology

## 2021-02-20 DIAGNOSIS — R1013 Epigastric pain: Secondary | ICD-10-CM

## 2021-02-20 DIAGNOSIS — K5900 Constipation, unspecified: Secondary | ICD-10-CM

## 2021-02-25 ENCOUNTER — Other Ambulatory Visit: Payer: Self-pay | Admitting: Gastroenterology

## 2021-02-25 DIAGNOSIS — R1012 Left upper quadrant pain: Secondary | ICD-10-CM

## 2021-02-25 DIAGNOSIS — R1013 Epigastric pain: Secondary | ICD-10-CM

## 2021-03-11 ENCOUNTER — Ambulatory Visit
Admission: RE | Admit: 2021-03-11 | Discharge: 2021-03-11 | Disposition: A | Discharge status: Home / Self Care | Payer: No Typology Code available for payment source | Source: Ambulatory Visit | Attending: Gastroenterology | Admitting: Gastroenterology

## 2021-03-11 DIAGNOSIS — R1013 Epigastric pain: Secondary | ICD-10-CM

## 2021-03-11 DIAGNOSIS — R1012 Left upper quadrant pain: Secondary | ICD-10-CM

## 2021-03-13 ENCOUNTER — Other Ambulatory Visit: Payer: PRIVATE HEALTH INSURANCE

## 2021-12-11 ENCOUNTER — Other Ambulatory Visit: Payer: Self-pay | Admitting: Gastroenterology

## 2021-12-11 DIAGNOSIS — R9389 Abnormal findings on diagnostic imaging of other specified body structures: Secondary | ICD-10-CM

## 2021-12-12 ENCOUNTER — Ambulatory Visit
Admission: RE | Admit: 2021-12-12 | Discharge: 2021-12-12 | Disposition: A | Discharge status: Home / Self Care | Payer: No Typology Code available for payment source | Source: Ambulatory Visit | Attending: Gastroenterology | Admitting: Gastroenterology

## 2021-12-12 DIAGNOSIS — R9389 Abnormal findings on diagnostic imaging of other specified body structures: Secondary | ICD-10-CM

## 2022-02-19 ENCOUNTER — Other Ambulatory Visit: Payer: Self-pay | Admitting: Gastroenterology

## 2022-02-19 DIAGNOSIS — Z8489 Family history of other specified conditions: Secondary | ICD-10-CM

## 2022-02-19 DIAGNOSIS — K31A0 Gastric intestinal metaplasia, unspecified: Secondary | ICD-10-CM

## 2022-02-19 DIAGNOSIS — R1012 Left upper quadrant pain: Secondary | ICD-10-CM

## 2022-03-05 ENCOUNTER — Ambulatory Visit
Admission: RE | Admit: 2022-03-05 | Discharge: 2022-03-05 | Disposition: A | Discharge status: Home / Self Care | Payer: No Typology Code available for payment source | Source: Ambulatory Visit | Attending: Gastroenterology | Admitting: Gastroenterology

## 2022-03-05 DIAGNOSIS — K31A0 Gastric intestinal metaplasia, unspecified: Secondary | ICD-10-CM

## 2022-03-05 DIAGNOSIS — R1012 Left upper quadrant pain: Secondary | ICD-10-CM

## 2022-03-05 DIAGNOSIS — Z8489 Family history of other specified conditions: Secondary | ICD-10-CM

## 2022-03-05 MED ORDER — IOPAMIDOL (ISOVUE-300) INJECTION 61%
100.0000 mL | Freq: Once | INTRAVENOUS | Status: AC | PRN
  Administered 2022-03-05: 100 mL via INTRAVENOUS

## 2023-06-16 NOTE — Progress Notes (Signed)
 OB/GYN Well-Woman Exam  HPI: Alicia Lozano is a 43 y.o. female who presents today for her annual well woman exam.  No urgency or leaking with laugh, cough or sneeze. No bulge symptoms.   Periods have not been crampy. No pain with intercourse. No pain with urination or bm. Periods are a little shorter. Now flow is 4 days. Still normal flow. No intermenstrual or postcoital bleeding. Not need lubricant.   ROS: As above.   OB History: OB History  Gravida Para Term Preterm AB Living  1  0  1   SAB IAB Ectopic Multiple Live Births  1        # Outcome Date GA Lbr Len/2nd Weight Sex Type Anes PTL Lv  1 SAB 07/2016 [redacted]w[redacted]d    SAB       GYN History: Patient's last menstrual period was 05/30/2023. Last Pap: normal 2023  Past Medical History:  Diagnosis Date  . Endometriosis of pelvic peritoneum   . Heterozygous MTHFR mutation [id number]   . Thyroiditis    02/2018 - elevated TPO Ab    Past Surgical History:  Procedure Laterality Date  . Laparoscopic endometriosis fulguration  2015   Dr. Lavoie in [place] - removed fibroids  . Myomectomy  06/2017   L/s hand assisted myomectomy, fulguration of endometriosis. - Dr. Caye    Family History  Problem Relation Age of Onset  . No Known Problems Father   . No Known Problems Mother   . No Known Problems Sister   . No Known Problems Sister   . Breast cancer Neg Hx   . Ovarian cancer Neg Hx   . Diabetes Neg Hx   . Hypertension Neg Hx   Colon cancer in aunt - 43 years old  Social History   Socioeconomic History  . Marital status: Married  Tobacco Use  . Smoking status: Never  . Smokeless tobacco: Never  Vaping Use  . Vaping status: Never Used  Substance and Sexual Activity  . Alcohol use: Yes    Comment: occ  . Drug use: Never  . Sexual activity: Yes    Partners: Male    Birth control/protection: None    Medications: Current Outpatient Medications on File Prior to Visit  Medication Sig Dispense Refill  . Chorionic  Gonadotropin (NOVAREL) 10000 units injection Inject 10 mLs (10,000 Units dose) into the muscle once for 1 dose. Max Daily Amount: 10,000 Units (Patient not taking: Reported on 06/16/2023) 2 each 3  . Chorionic Gonadotropin (PREGNYL) 10000 units injection Inject 15 mLs (15,000 Units dose) as directed as needed. May dispense generic.  Please dispense needles syringes needed for SQ injection. (Patient not taking: Reported on 06/16/2023) 2 each 1  . Coenzyme Q10 (CO Q 10 PO) Take by mouth.    . fish oil (SEA-OMEGA) 1000 mg CAPS Take by mouth. (Patient not taking: Reported on 06/16/2023)    . fluconazole (DIFLUCAN) 150 mg tablet Take one pill by mouth every 72 hours x3 doses, then take one pill monthly for 6 months (Patient not taking: Reported on 06/16/2023) 15 tablet 0  . metroNIDAZOLE (METROGEL) 0.75% vaginal gel Place one applicator dose vaginally twice weekly for 6 months (Patient not taking: Reported on 06/16/2023) 70 g 5  . Multiple Vitamin (MULTI-VITAMIN PO) Take by mouth.    . polyethylene glycol (MIRALAX,GAVILAX,CLEARLAX) 17 GM/SCOOP powder  (Patient not taking: Reported on 06/16/2023)    . RED YEAST RICE EXTRACT PO Take by mouth.    . TAMOXIFEN CITRATE PO  Take by mouth. CD 3-7    . UNABLE TO FIND Med Name: HCG 15,000 units SQ x1 when directed. (Patient not taking: Reported on 06/16/2023) 1 each 2   Current Facility-Administered Medications on File Prior to Visit  Medication Dose Route Frequency Provider Last Rate Last Admin  . progesterone injection 200 mg  200 mg Intramuscular Every Other Day    200 mg at 06/14/23 1256    Allergies: No Known Allergies     Physical Exam Vitals signs and nursing note reviewed  Constitutional:      Appearance: She is normally developed. BMI: Body mass index is 24.63 kg/m. Neurological:     Mental Status: She is alert. She is oriented.   Psychiatric:        Behavior: Behavior normal.        Thought Content: Thought content normal.     HENT:      Normocephalic and atraumatic Cardiovascular:     RRR, no significant murmur Pulmonary:     CTAB, no wheezes Breast:    Normal breasts without skin changes, dimpling, masses, or nipple discharge Abdominal:     There is no distension. Abdomen is soft. There is no abdominal tenderness. There is no guarding or rebound.  GU:    External Genitalia: No vulvar scars, lesions, skin changes, edema, cysts, significant asymmetries or tenderness.                                   No clitoral edema.                                   Normal urethral meatus without lesions or prolapse.     Vagina: normal appearance, normal discharge, no lesions, no significant cystocele or rectocele    Cervix: normal with normal physiologic discharge, no CMT    Uterus: normal sized, no tenderness    Adnexa: no obvious adnexal masses, no significant tenderness    Pelvic floor evaluation: normal pelvic exam Musculoskeletal: Normal range of motion. Normal gait. Skin:    General: Skin is without visible concerning lesions.    Assessment/Plan: 43 y.o. y.o. G33P0010 female here for well-woman exam 1. Infertility   - ongoing treatment 2. Cervical cancer screening: normal 2023 3. Contraception discussed and pt plans on utilizing the following method: attempting to conceive 4. Health maintenance:             - BP wnl:     Vitals:    04/30/21 1541  BP: 104/62                 - GYN Health Screening                         - discussed breast self-awareness                          - no prolapse or incontinence symptoms    - normal mammogram 2025             - Routine Health screening                         - Weight assessment: BMI: Body mass index is 24.1 kg/m., discussed                               -  Denies excessive alcohol, tobacco, and illicit drug use                         -  lipid panel with PCP 5. RTC annually for wwe      Chyrl Pouch, DO

## 2023-06-22 ENCOUNTER — Other Ambulatory Visit (HOSPITAL_BASED_OUTPATIENT_CLINIC_OR_DEPARTMENT_OTHER): Payer: Self-pay | Admitting: Family Medicine

## 2023-06-22 DIAGNOSIS — E785 Hyperlipidemia, unspecified: Secondary | ICD-10-CM

## 2023-06-23 ENCOUNTER — Ambulatory Visit (HOSPITAL_BASED_OUTPATIENT_CLINIC_OR_DEPARTMENT_OTHER)
Admission: RE | Admit: 2023-06-23 | Discharge: 2023-06-23 | Disposition: A | Discharge status: Home / Self Care | Payer: Self-pay | Source: Ambulatory Visit | Attending: Family Medicine | Admitting: Family Medicine

## 2023-06-23 DIAGNOSIS — E785 Hyperlipidemia, unspecified: Secondary | ICD-10-CM | POA: Insufficient documentation

## 2023-07-25 IMAGING — US US ABDOMEN COMPLETE
1 series · 13 of 25 positions shown · non-contrast
Comparison: 01/19/2017

CLINICAL DATA: Left upper quadrant and epigastric pain for 2 months

EXAM:
ABDOMEN ULTRASOUND COMPLETE

[Series 1: us abdomen complete · 0.19mm/px · 13 of 98 slices shown]
[im 1/98]
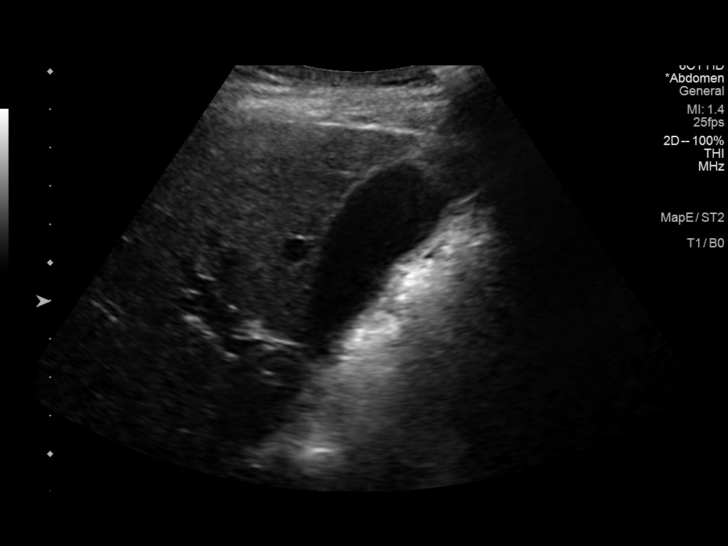
[im 9/98]
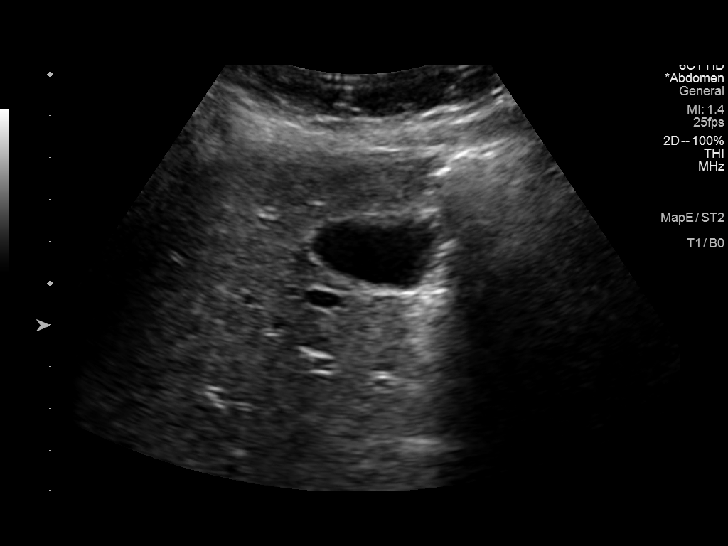
[im 17/98]
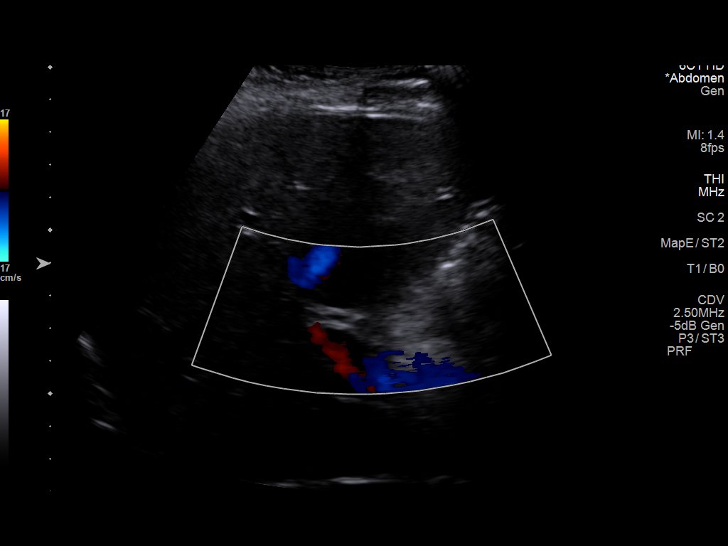
[im 25/98]
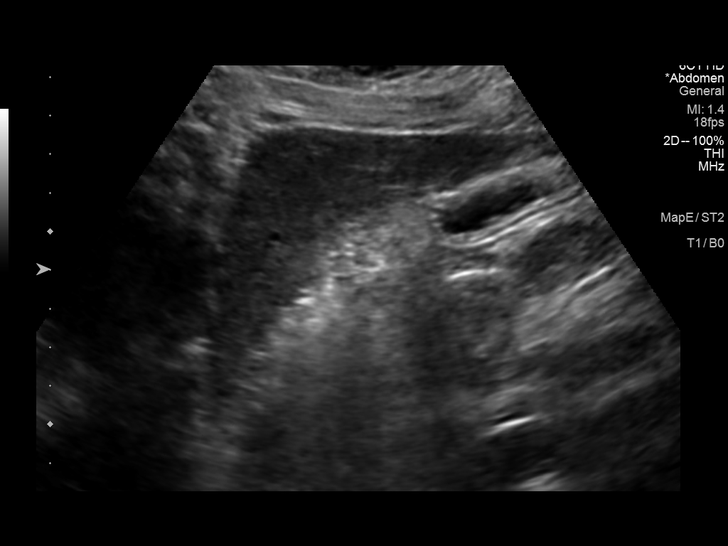
[im 33/98]
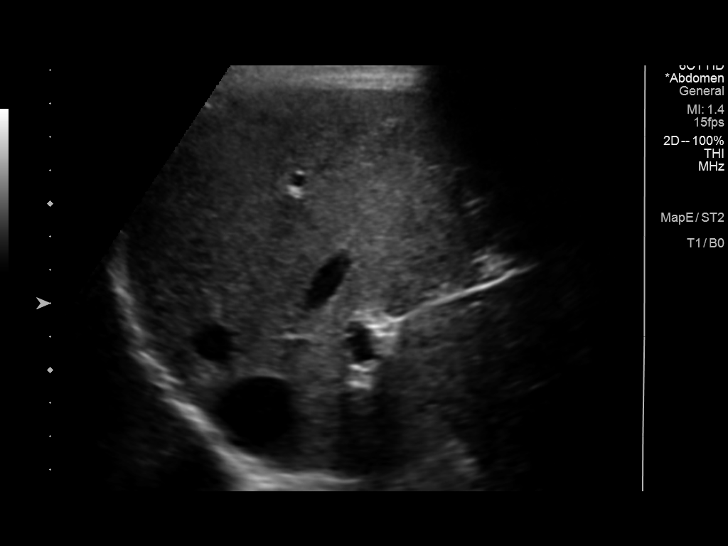
[im 41/98]
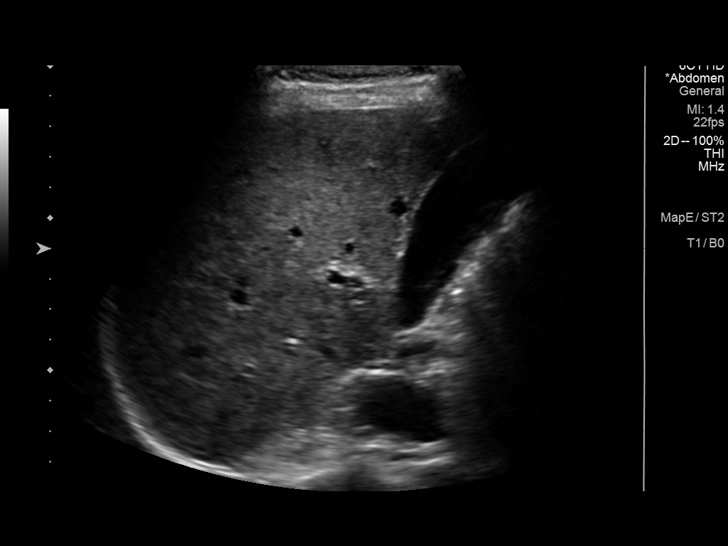
[im 49/98]
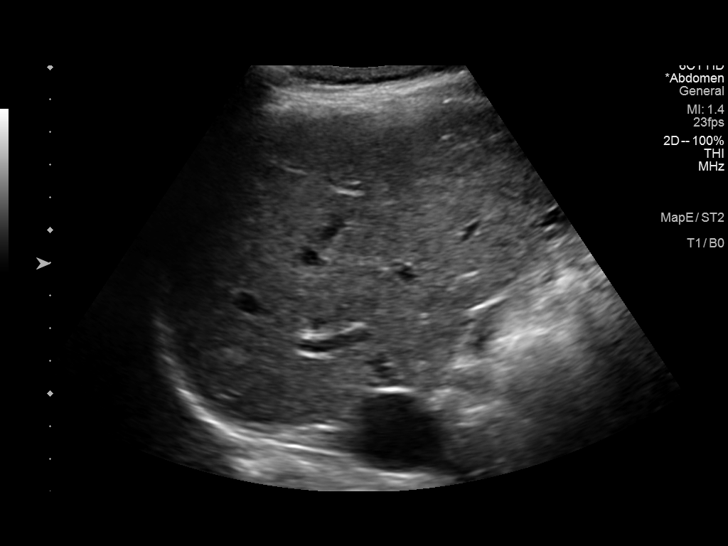
[im 57/98]
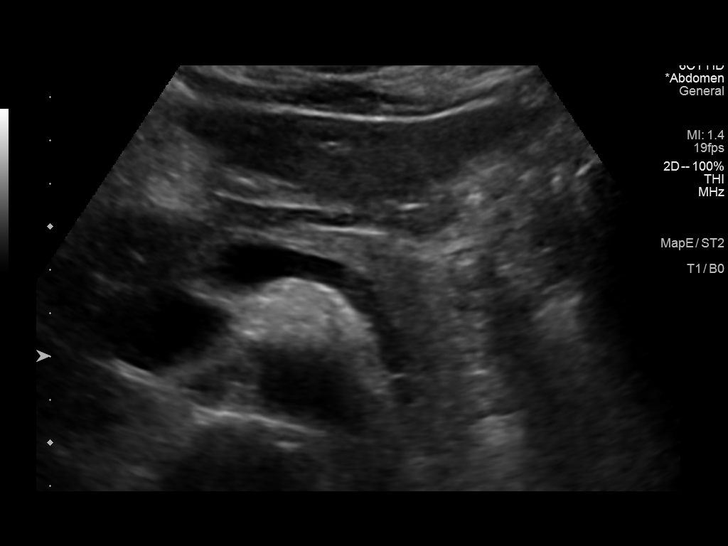
[im 65/98]
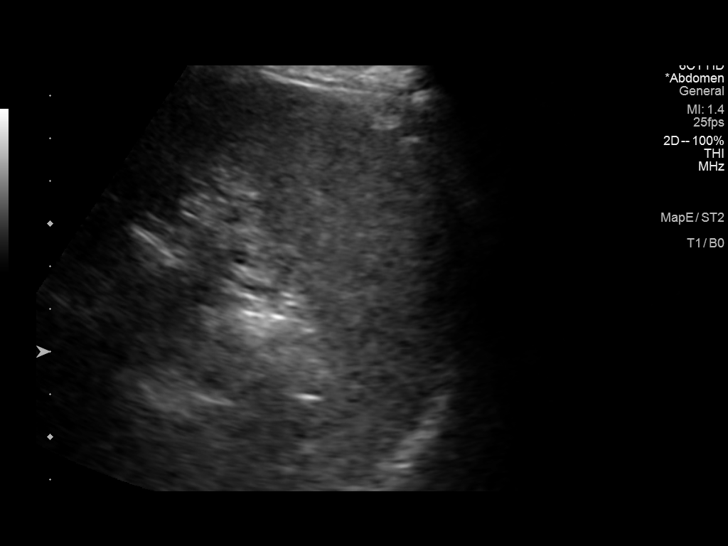
[im 73/98]
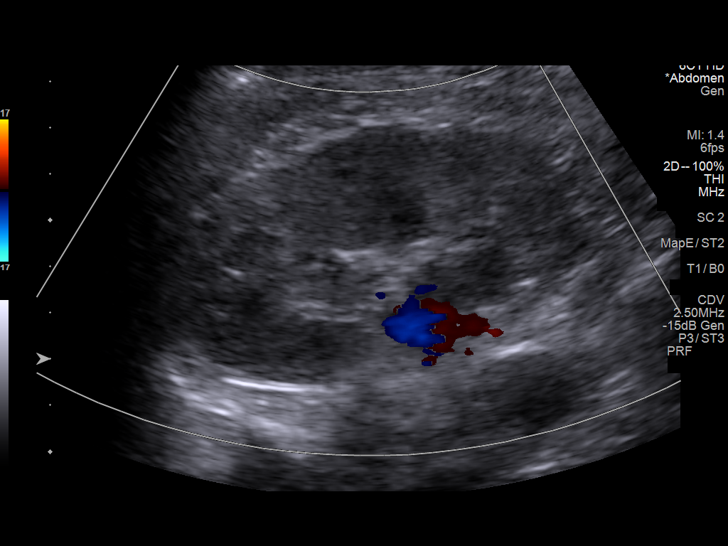
[im 81/98]
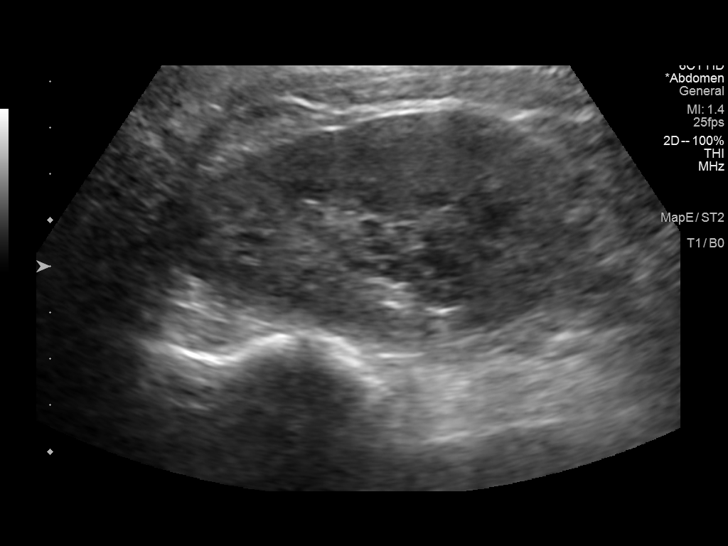
[im 89/98]
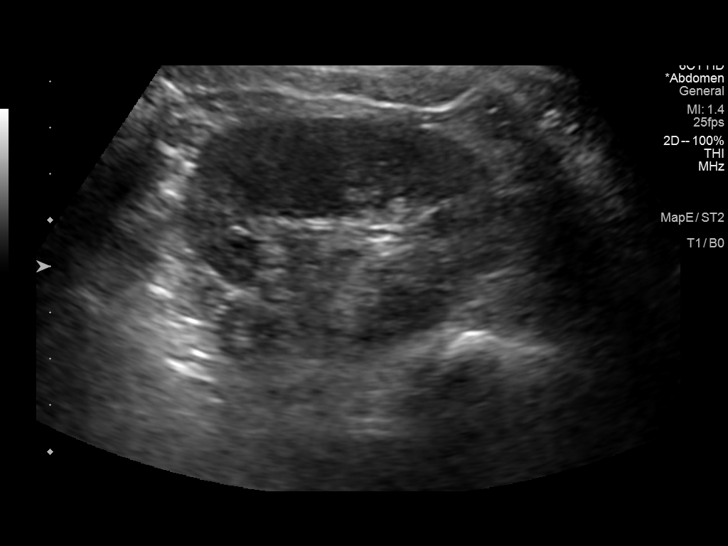
[im 98/98]
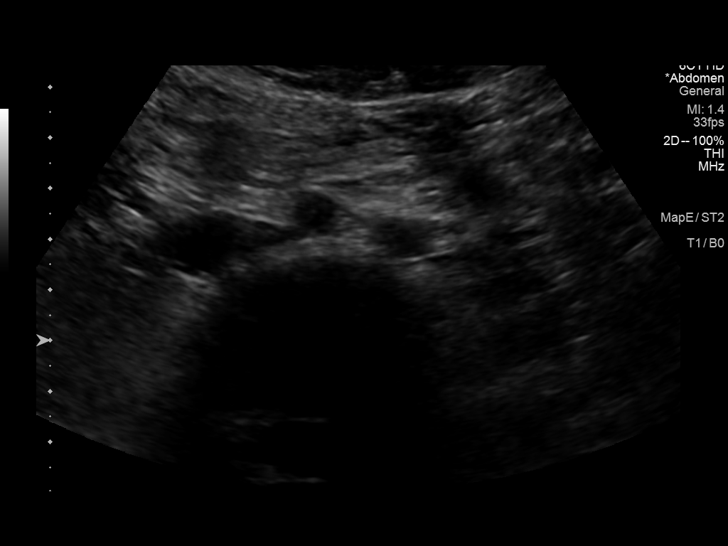

[13 of 25 positions shown; findings below may reference images not displayed]

FINDINGS: Gallbladder: No gallstones or wall thickening visualized. No
sonographic Murphy sign noted by sonographer.

Common bile duct: Diameter: 3 mm

Liver: There is a 1.4 by 1.2 x 1.9 cm hyperechoic focus within the
right lobe liver most consistent with small hemangioma. The
remainder of the liver parenchyma is normal appearance. No
intrahepatic duct dilation. Portal vein is patent on color Doppler
imaging with normal direction of blood flow towards the liver.

IVC: No abnormality visualized.

Pancreas: Visualized portion unremarkable.

Spleen: Size and appearance within normal limits.

Right Kidney: Length: 10.2 cm. Echogenicity within normal limits. No
mass or hydronephrosis visualized.

Left Kidney: Length: 10.3 cm. Echogenicity within normal limits. No
mass or hydronephrosis visualized.

Abdominal aorta: No aneurysm visualized.

Other findings: None.
IMPRESSION: 1. 1.9 cm hypoechoic focus within the posterior right lobe liver,
most compatible with hemangioma. If further evaluation is desired,
dedicated liver MRI could be considered.
2. Otherwise unremarkable abdominal ultrasound. No abnormality to
explain the patient's left upper quadrant and epigastric pain.

## 2024-01-19 ENCOUNTER — Ambulatory Visit

## 2024-01-19 VITALS — BP 120/57 | HR 67 | Ht 65.0 in | Wt 148.4 lb

## 2024-01-19 DIAGNOSIS — M542 Cervicalgia: Secondary | ICD-10-CM | POA: Diagnosis not present

## 2024-01-19 DIAGNOSIS — R21 Rash and other nonspecific skin eruption: Secondary | ICD-10-CM | POA: Diagnosis not present

## 2024-01-19 DIAGNOSIS — N62 Hypertrophy of breast: Secondary | ICD-10-CM

## 2024-01-19 DIAGNOSIS — M549 Dorsalgia, unspecified: Secondary | ICD-10-CM

## 2024-01-19 NOTE — Progress Notes (Signed)
 BREAST REDUCTION CONSULT Plastic & Reconstructive Surgery New Patient Visit  Patient: Alicia Lozano MRN: [id number] Date: 01/19/2024 Referring Physician: Douglass Ivanoff, MD  Chief Complaint: Symptomatic macromastia, cervicalgia  History of Present Illness:  This is a 43 y.o. woman with PMH and PSH as described below who presents in consultation for breast reduction.   The patient states she has been considering a breast reduction for years. She describes intermittent skin irritation in the breast folds and occasional rashes.   Back pain in the upper and lower back, including neck pain. She pulls or pins her bra straps to provide better lift and relief of the pressure and pain. She notices relief by holding her breast up manually.  Her shoulder straps cause grooves and pain and pressure that requires padding for relief. Pain medication is sometimes required with motrin and tylenol .  Activities that are hindered by enlarged breasts include: exercise and running.  She has tried supportive clothing as well as fitted bras without improvement.  She currently wears a DDD cup bra and would ideally like to be a B-C cup.   She has no personal history of breast abnormalities and has never had any breast biopsies or surgeries.  Her last mammogram was on 11/09/2023 and was normal.   She no recent weight changes. Of note, she has not breastfed children and is not planning child bearing.   Past Medical History: Past Medical History:  Diagnosis Date   Asthma    Endometriosis     Past Surgical History: Past Surgical History:  Procedure Laterality Date   DIAGNOSTIC LAPAROSCOPY     gel port assisted myomectomy left ovarian cystectomy and excision of endometriosis Dr. Yalcinkaya 06-29-17   LAPAROSCOPIC GELPORT ASSISTED MYOMECTOMY N/A 06/29/2017   Procedure: LAPAROSCOPIC GELPORT ASSISTED MYOMECTOMY, LEFT OVARIAN DERMOID CYST REMOVAL, ABLATION OF ENDOMETRIOSIS AND CHROMOPERTUBATION;  Surgeon: Yalcinkaya,  Tamer, MD;  Location: [facility];  Service: Gynecology;  Laterality: N/A;   LAPAROSCOPIC OVARIAN CYSTECTOMY Left 06/03/2012   Procedure: LAPAROSCOPY WITH CONSERVATIVE TREATMENT OF ENDOMETROSIS; HYDROTUBATION WITH METHLYLELE BLUE;  Surgeon: Marie-Lyne Lavoie, MD;  Location: [facility];  Service: Gynecology;  Laterality: Left;  90 min.   NO PAST SURGERIES     Current Medications: Current Outpatient Medications on File Prior to Visit  Medication Sig Dispense Refill   Omega-3 Fatty Acids (FISH OIL PO) Take 2 capsules by mouth daily.     albuterol (PROVENTIL HFA;VENTOLIN HFA) 108 (90 BASE) MCG/ACT inhaler Inhale 2 puffs into the lungs every 6 (six) hours as needed for wheezing or shortness of breath.     Multiple Vitamins-Minerals (CENTRUM SILVER PO) Take 2 tablets by mouth daily.     ondansetron  (ZOFRAN ) 4 MG tablet Take 1 tablet (4 mg total) by mouth every 8 (eight) hours as needed for nausea or vomiting. 15 tablet 0   oxyCODONE -acetaminophen  (PERCOCET) 7.5-325 MG tablet Take 1 tablet by mouth every 4 (four) hours as needed. 15 tablet 0   No current facility-administered medications on file prior to visit.   Allergies: No Known Allergies  Family History:  History of breast cancer in family negative. Family history is negative for bleeding/clotting disorders, problems with anesthesia, connective tissue disorders.   Social History:  Social History   Socioeconomic History   Marital status: Married    Spouse name: Not on file   Number of children: Not on file   Years of education: Not on file   Highest education level: Not on file  Occupational History  Not on file  Tobacco Use   Smoking status: Never   Smokeless tobacco: Never  Vaping Use   Vaping status: Never Used  Substance and Sexual Activity   Alcohol use: No   Drug use: No   Sexual activity: Yes  Other Topics Concern   Not on file  Social History Narrative   Not on file   Social Drivers of Health    Financial Resource Strain: Low Risk  (06/16/2023)   Received from [facility]   Overall Financial Resource Strain (CARDIA)    Difficulty of Paying Living Expenses: Not hard at all  Food Insecurity: No Food Insecurity (06/16/2023)   Received from [facility]   Hunger Vital Sign    Within the past 12 months, you worried that your food would run out before you got the money to buy more.: Never true    Within the past 12 months, the food you bought just didn't last and you didn't have money to get more.: Never true  Transportation Needs: No Transportation Needs (06/16/2023)   Received from [facility] - Transportation    Lack of Transportation (Medical): No    Lack of Transportation (Non-Medical): No  Physical Activity: Insufficiently Active (06/16/2023)   Received from [facility]   Exercise Vital Sign    On average, how many days per week do you engage in moderate to strenuous exercise (like a brisk walk)?: 2 days    On average, how many minutes do you engage in exercise at this level?: 30 min  Stress: No Stress Concern Present (06/16/2023)   Received from [facility] - Occupational Stress Questionnaire    Feeling of Stress : Only a little  Social Connections: Moderately Integrated (06/16/2023)   Received from [facility]   Social Network    How would you rate your social network (family, work, friends)?: Adequate participation with social networks   Smoker: Denies Recreational drug use: Denies  Review of systems: 10 point review of systems performed and negative except as noted in the HPI  Physical Exam: BP (!) 120/57 (BP Location: Left Arm, Patient Position: Sitting, Cuff Size: Normal)   Pulse 67   Ht 5' 5 (1.651 m)   Wt 148 lb 6.4 oz (67.3 kg)   SpO2 99%   BMI 24.70 kg/m  Body mass index is 24.7 kg/m.  Body surface area is 1.76 meters squared.  Physical Exam           General: Well appearing, no  apparent distress. Chest: Chest wall without abnormality or obvious deformity. No scoliosis, pectus excavatum or pectus carinatum. Breast: Bilateral macromastia. Grade 2 ptosis bilaterally. Breast parenchyma is fibrous. There are no palpable breast masses in either breast. Bilateral NAC viable and sensate. Papules everted without discharge. NACs positioned along breast meridian bilaterally. Skin quality is poor. There are no skin lesions, scars or dimpling.  - Breast measurements:  Measurements  Right Breast (cm)  Left Breast (cm)  SN-Nipple 26 27  Base Width 16 16.5  Nipple - IMF 11.5 11.5  NAC diameter 3 * 3.5 3*4  Internipple Distance 27  Neuro: Moving all four extremities spontaneously.  Psych: Appropriate mood and affect.   Assessment: In summary, this is a pleasant 43 y.o. year-old woman presenting for consultation for bilateral breast reduction in setting of symptomatic macromastia.   After evaluation today, I believe the patient would be an appropriate candidate for the operation. Based on her  Schnur scale, she would require a 430 g reduction per side. I think a 430 g resection would be attainable through a reduction mammoplasty. We discussed the operation in detail including the potential incision patterns (e.g., vertical only versus wise pattern), and possible need for surgical drains. Based on her clinical exam, she will likely require a wise pattern with inferior pedicle.   We discussed the risks of this procedure which include but are not limited to: bleeding, infection, seroma, delayed wound healing, wound dehiscence, asymmetries, fat necrosis, hypertrophic and keloid scarring, decreased or loss of nipple sensation, partial or full loss of the NAC, numbness, paresthesias, injuries to arteries/nerves/veins, need for revision procedures, further out-of-pocket expenses for ongoing medical care, and potential need for repeat reduction in the future. We discussed that pregnancy can alter  the size and shape of her breasts and revision procedures may be required after pregnancy. We discussed that while she has the same potential to breast feed as a woman who has never had a breast reduction, she may have less milk production and may require formula supplementation. We further discussed the risk of DVT/PE, fat embolism, heart attack, stroke, death as well as the risks of anesthesia. We reviewed the expected recovery period with no heavy activities or lifting >5lbs for 6 weeks postoperatively.   The patient voiced understanding and wishes to proceed. All questions and concerns were addressed to the patient's apparent satisfaction.   Plan: - Plan for bilateral reduction mammaplasty with liposuction - 4 hours under general anesthesia. - Will submit for pre-determination with insurance. - Scheduling pending insurance. - Needs to bring copy of mammogram. No clearance.  The sensitive parts of the examination/procedure were performed with MA as chaperone.  The time documented represents the total time spent on the day of the encounter in preparing for and completing the visit. It does not include time spent by ancillary staff, a resident, a fellow, another trainee, or, for shared visits, time spent jointly with the patient or discussing the case or the performance of other separately performed services.  Time spent: 45 minutes.     Amiri Riechers, MD [facility]  01/19/2024 12:29 PM
